# Patient Record
Sex: Female | Born: 1975
Health system: Southern US, Community
[De-identification: ages and names within clinical notes are randomized; demographics above are authoritative.]

## PROBLEM LIST (undated history)

## (undated) DIAGNOSIS — D649 Anemia, unspecified: Secondary | ICD-10-CM

## (undated) DIAGNOSIS — C539 Malignant neoplasm of cervix uteri, unspecified: Secondary | ICD-10-CM

## (undated) DIAGNOSIS — G8929 Other chronic pain: Secondary | ICD-10-CM

## (undated) DIAGNOSIS — M26609 Unspecified temporomandibular joint disorder, unspecified side: Secondary | ICD-10-CM

## (undated) DIAGNOSIS — U071 COVID-19: Secondary | ICD-10-CM

## (undated) DIAGNOSIS — K219 Gastro-esophageal reflux disease without esophagitis: Secondary | ICD-10-CM

## (undated) DIAGNOSIS — Z8489 Family history of other specified conditions: Secondary | ICD-10-CM

## (undated) DIAGNOSIS — G43009 Migraine without aura, not intractable, without status migrainosus: Secondary | ICD-10-CM

## (undated) DIAGNOSIS — R251 Tremor, unspecified: Secondary | ICD-10-CM

## (undated) DIAGNOSIS — N644 Mastodynia: Secondary | ICD-10-CM

## (undated) DIAGNOSIS — F32A Depression, unspecified: Secondary | ICD-10-CM

## (undated) DIAGNOSIS — Z9889 Other specified postprocedural states: Secondary | ICD-10-CM

## (undated) DIAGNOSIS — E282 Polycystic ovarian syndrome: Secondary | ICD-10-CM

## (undated) DIAGNOSIS — J45909 Unspecified asthma, uncomplicated: Secondary | ICD-10-CM

## (undated) DIAGNOSIS — F419 Anxiety disorder, unspecified: Secondary | ICD-10-CM

## (undated) DIAGNOSIS — M1990 Unspecified osteoarthritis, unspecified site: Secondary | ICD-10-CM

## (undated) HISTORY — PX: WRIST SURGERY: SHX841

## (undated) HISTORY — DX: Malignant neoplasm of cervix uteri, unspecified: C53.9

## (undated) HISTORY — PX: ROBOTIC ASSISTED TOTAL HYSTERECTOMY: SHX6085

## (undated) HISTORY — DX: Polycystic ovarian syndrome: E28.2

## (undated) HISTORY — DX: Tremor, unspecified: R25.1

## (undated) HISTORY — DX: Other chronic pain: G89.29

## (undated) HISTORY — DX: Migraine without aura, not intractable, without status migrainosus: G43.009

## (undated) HISTORY — DX: Depression, unspecified: F32.A

---

## 1999-09-21 ENCOUNTER — Ambulatory Visit (HOSPITAL_BASED_OUTPATIENT_CLINIC_OR_DEPARTMENT_OTHER): Admission: RE | Admit: 1999-09-21 | Discharge: 1999-09-21 | Payer: Self-pay | Admitting: Orthopedic Surgery

## 2005-11-27 ENCOUNTER — Emergency Department (HOSPITAL_COMMUNITY): Admission: EM | Admit: 2005-11-27 | Discharge: 2005-11-27 | Payer: Self-pay | Admitting: Emergency Medicine

## 2006-02-17 ENCOUNTER — Inpatient Hospital Stay (HOSPITAL_COMMUNITY): Admission: AD | Admit: 2006-02-17 | Discharge: 2006-02-17 | Payer: Self-pay | Admitting: Obstetrics & Gynecology

## 2006-05-30 ENCOUNTER — Emergency Department (HOSPITAL_COMMUNITY): Admission: EM | Admit: 2006-05-30 | Discharge: 2006-05-30 | Payer: Self-pay | Admitting: Emergency Medicine

## 2007-09-12 ENCOUNTER — Other Ambulatory Visit: Admission: RE | Admit: 2007-09-12 | Discharge: 2007-09-12 | Payer: Self-pay | Admitting: Family Medicine

## 2007-10-24 ENCOUNTER — Emergency Department (HOSPITAL_COMMUNITY): Admission: EM | Admit: 2007-10-24 | Discharge: 2007-10-24 | Payer: Self-pay | Admitting: Emergency Medicine

## 2008-01-22 ENCOUNTER — Emergency Department (HOSPITAL_COMMUNITY): Admission: EM | Admit: 2008-01-22 | Discharge: 2008-01-22 | Payer: Self-pay | Admitting: Emergency Medicine

## 2008-01-31 ENCOUNTER — Emergency Department (HOSPITAL_COMMUNITY): Admission: EM | Admit: 2008-01-31 | Discharge: 2008-02-01 | Payer: Self-pay | Admitting: Emergency Medicine

## 2008-07-25 ENCOUNTER — Emergency Department (HOSPITAL_COMMUNITY): Admission: EM | Admit: 2008-07-25 | Discharge: 2008-07-25 | Payer: Self-pay | Admitting: Emergency Medicine

## 2008-09-26 ENCOUNTER — Other Ambulatory Visit: Admission: RE | Admit: 2008-09-26 | Discharge: 2008-09-26 | Payer: Self-pay | Admitting: Family Medicine

## 2008-10-08 ENCOUNTER — Inpatient Hospital Stay (HOSPITAL_COMMUNITY): Admission: AD | Admit: 2008-10-08 | Discharge: 2008-10-08 | Payer: Self-pay | Admitting: Obstetrics and Gynecology

## 2008-10-15 ENCOUNTER — Inpatient Hospital Stay (HOSPITAL_COMMUNITY): Admission: AD | Admit: 2008-10-15 | Discharge: 2008-10-15 | Payer: Self-pay | Admitting: Obstetrics and Gynecology

## 2010-02-02 ENCOUNTER — Emergency Department (HOSPITAL_COMMUNITY)
Admission: EM | Admit: 2010-02-02 | Discharge: 2010-02-02 | Payer: Self-pay | Source: Home / Self Care | Admitting: Emergency Medicine

## 2010-02-12 ENCOUNTER — Emergency Department (HOSPITAL_COMMUNITY)
Admission: EM | Admit: 2010-02-12 | Discharge: 2010-02-12 | Payer: Self-pay | Source: Home / Self Care | Admitting: Emergency Medicine

## 2010-04-23 LAB — CBC
HCT: 39.7 % (ref 36.0–46.0)
HCT: 41.5 % (ref 36.0–46.0)
Hemoglobin: 13.6 g/dL (ref 12.0–15.0)
Hemoglobin: 14 g/dL (ref 12.0–15.0)
MCHC: 33.8 g/dL (ref 30.0–36.0)
MCHC: 34.4 g/dL (ref 30.0–36.0)
MCV: 85.2 fL (ref 78.0–100.0)
MCV: 86.5 fL (ref 78.0–100.0)
Platelets: 209 10*3/uL (ref 150–400)
Platelets: 219 10*3/uL (ref 150–400)
RBC: 4.66 MIL/uL (ref 3.87–5.11)
RBC: 4.79 MIL/uL (ref 3.87–5.11)
RDW: 13.2 % (ref 11.5–15.5)
RDW: 13.3 % (ref 11.5–15.5)
WBC: 6.6 10*3/uL (ref 4.0–10.5)
WBC: 7 10*3/uL (ref 4.0–10.5)

## 2010-04-23 LAB — URINALYSIS, ROUTINE W REFLEX MICROSCOPIC
Bilirubin Urine: NEGATIVE
Bilirubin Urine: NEGATIVE
Glucose, UA: NEGATIVE mg/dL
Glucose, UA: NEGATIVE mg/dL
Hgb urine dipstick: NEGATIVE
Hgb urine dipstick: NEGATIVE
Ketones, ur: NEGATIVE mg/dL
Ketones, ur: NEGATIVE mg/dL
Nitrite: NEGATIVE
Nitrite: NEGATIVE
Protein, ur: NEGATIVE mg/dL
Protein, ur: NEGATIVE mg/dL
Specific Gravity, Urine: >1.03 — ABNORMAL HIGH (ref 1.005–1.030)
Specific Gravity, Urine: >1.03 — ABNORMAL HIGH (ref 1.005–1.030)
Urobilinogen, UA: 0.2 mg/dL (ref 0.0–1.0)
Urobilinogen, UA: 0.2 mg/dL (ref 0.0–1.0)
pH: 5.5 (ref 5.0–8.0)
pH: 6 (ref 5.0–8.0)

## 2010-04-23 LAB — COMPREHENSIVE METABOLIC PANEL
ALT: 12 U/L (ref 0–35)
ALT: 14 U/L (ref 0–35)
AST: 13 U/L (ref 0–37)
AST: 15 U/L (ref 0–37)
Albumin: 3.6 g/dL (ref 3.5–5.2)
Albumin: 3.7 g/dL (ref 3.5–5.2)
Alkaline Phosphatase: 50 U/L (ref 39–117)
Alkaline Phosphatase: 52 U/L (ref 39–117)
BUN: 7 mg/dL (ref 6–23)
BUN: 9 mg/dL (ref 6–23)
CO2: 24 mEq/L (ref 19–32)
CO2: 24 mEq/L (ref 19–32)
Calcium: 8.7 mg/dL (ref 8.4–10.5)
Calcium: 8.8 mg/dL (ref 8.4–10.5)
Chloride: 103 mEq/L (ref 96–112)
Chloride: 107 mEq/L (ref 96–112)
Creatinine, Ser: 0.72 mg/dL (ref 0.4–1.2)
Creatinine, Ser: 0.78 mg/dL (ref 0.4–1.2)
GFR calc Af Amer: >60 mL/min (ref >60)
GFR calc Af Amer: >60 mL/min (ref >60)
GFR calc non Af Amer: >60 mL/min (ref >60)
GFR calc non Af Amer: >60 mL/min (ref >60)
Glucose, Bld: 126 mg/dL — ABNORMAL HIGH (ref 70–99)
Glucose, Bld: 81 mg/dL (ref 70–99)
Potassium: 3.8 mEq/L (ref 3.5–5.1)
Potassium: 3.9 mEq/L (ref 3.5–5.1)
Sodium: 133 mEq/L — ABNORMAL LOW (ref 135–145)
Sodium: 136 mEq/L (ref 135–145)
Total Bilirubin: 0.4 mg/dL (ref 0.3–1.2)
Total Bilirubin: 0.6 mg/dL (ref 0.3–1.2)
Total Protein: 6.3 g/dL (ref 6.0–8.3)
Total Protein: 6.8 g/dL (ref 6.0–8.3)

## 2010-04-23 LAB — WET PREP, GENITAL
Clue Cells Wet Prep HPF POC: NONE SEEN
Trich, Wet Prep: NONE SEEN
Yeast Wet Prep HPF POC: NONE SEEN

## 2010-04-23 LAB — POCT PREGNANCY, URINE
Preg Test, Ur: NEGATIVE
Preg Test, Ur: NEGATIVE

## 2010-04-23 LAB — GC/CHLAMYDIA PROBE AMP, GENITAL
Chlamydia, DNA Probe: NEGATIVE
GC Probe Amp, Genital: NEGATIVE

## 2010-04-25 LAB — RAPID URINE DRUG SCREEN, HOSP PERFORMED
Amphetamines: NOT DETECTED
Barbiturates: NOT DETECTED
Benzodiazepines: NOT DETECTED
Cocaine: NOT DETECTED
Opiates: NOT DETECTED
Tetrahydrocannabinol: NOT DETECTED

## 2010-04-25 LAB — URINALYSIS, ROUTINE W REFLEX MICROSCOPIC
Bilirubin Urine: NEGATIVE
Glucose, UA: NEGATIVE mg/dL
Hgb urine dipstick: NEGATIVE
Ketones, ur: NEGATIVE mg/dL
Nitrite: NEGATIVE
Protein, ur: NEGATIVE mg/dL
Specific Gravity, Urine: 1.021 (ref 1.005–1.030)
Urobilinogen, UA: 1 mg/dL (ref 0.0–1.0)
pH: 6 (ref 5.0–8.0)

## 2010-05-03 LAB — CBC
HCT: 40 % (ref 36.0–46.0)
Hemoglobin: 13.4 g/dL (ref 12.0–15.0)
MCHC: 33.4 g/dL (ref 30.0–36.0)
MCV: 86 fL (ref 78.0–100.0)
Platelets: 205 10*3/uL (ref 150–400)
RBC: 4.65 MIL/uL (ref 3.87–5.11)
RDW: 13.5 % (ref 11.5–15.5)
WBC: 7.3 10*3/uL (ref 4.0–10.5)

## 2010-05-03 LAB — POCT CARDIAC MARKERS
CKMB, poc: <1 ng/mL — ABNORMAL LOW (ref 1.0–8.0)
Myoglobin, poc: 36.4 ng/mL (ref 12–200)
Troponin i, poc: <0.05 ng/mL (ref 0.00–0.09)

## 2010-05-03 LAB — D-DIMER, QUANTITATIVE (NOT AT ARMC): D-Dimer, Quant: 0.27 ug/mL-FEU (ref 0.00–0.48)

## 2010-06-04 NOTE — Op Note (Signed)
[facility]  Patient:    Robin Weaver, Robin Weaver                       MRN: [id number] Proc. Date: 09/21/99 Adm. Date:  [id number] Attending:  Ronne Binning                           Operative Report  PREOPERATIVE DIAGNOSIS:  Right wrist pain with ganglion cyst, questionable triangular fibrocartilage complex tear.  POSTOPERATIVE DIAGNOSIS:  Right wrist pain with ganglion cyst, questionable triangular fibrocartilage complex  OPERATION:  Arthroscopy, debridement triangular fibrocartilage complex tear, removal of ganglion cyst - right wrist.  SURGEON:  Nicki Reaper, M.D.  ANESTHESIA:  Axillary block.  ANESTHESIOLOGIST:  Kaylyn Layer. Michelle Piper, M.D.  HISTORY:  The patient is a 35 year old female with a history of right wrist pain.  She has had a MRI done revealing questionable TFCC tear.  She has developed a mass over the dorsal aspect, fourth dorsal compartment.  PROCEDURE:  The patient was brought to the operating room, where an axillary block was carried out without difficulty.  She was prepped and draped using Betadine scrub and solution with the right arm free.  The limb was placed in the arthroscopy tower and 10 pounds of traction applied.  The joint inflated through the 4.5 portal.  The joint was entered through the 4.5 portal after localization with a needle.  A transverse incision was made, deepened with a hemostat.  A blunt trocar was used to enter the joint.  The joint was inspected.  The lunotriquetral joint was intact.  There was moderate synovitis present to the ulnar side.  The scapholunate ligament was slightly patulous, but appeared to be intact.  The volar, ulnar and radial ligaments were intact. The cartilage was intact.  The dorsal capsule was then inspected with pressure on the dorsal aspect, the cyst was immediately apparent.  A 3.4 portal was then opened after localization with a 22-gauge needle.  This was opened with a transverse  incision.  A hemostat was used to enter the joint.  The shaver was the introduced.  A second daughter cyst was present on the scapholunate ligament and this was debrided, as was the cyst opening the dorsal capsule until the flexor tendons were visible.  The scope was then placed in 3.4.  The ulnar aspect of the joint inspected and cleavage tear was present on the triangular fibrocartilage.  This was then removed with basket forceps, a shaver and smoothed with an ArthroWand.  A large prestyloid recess was present communicating with the extensor carpi ulnaris, but the TFCC dorsal attachment and peripheral attachment was entirely intact with a normal trampoline effect. The mid carpal joint was then inspected through the distal 3.4 and 5.6 portals, inflated, transverse incision was made, deepened with a hemostat, joint was entered.  STT joint showed no articular damage.  The scapholunate showed no opening.  Proximal capitate, distal lunate were normal.  There was a hamate facet on the lunate.  The lunotriquetral joint and hamate were entirely normal.  Triquetrium showed no injuries, no further lesions were identified. The instruments were removed.  Portals closed with interrupted 5-0 nylon sutures.  Sterile compressive dressing and splint was applied.  The patient tolerated the procedure well and was taken to the recovery room for observation in satisfactory condition.  She is discharged home to return to the [facility] in one  week on Vicodin and Keflex. DD:  09/21/99 TD:  09/21/99 Job: [id number]

## 2011-04-04 ENCOUNTER — Ambulatory Visit (INDEPENDENT_AMBULATORY_CARE_PROVIDER_SITE_OTHER): Payer: BC Managed Care – PPO | Admitting: Physician Assistant

## 2011-04-04 VITALS — BP 110/60 | HR 76 | Temp 98.8°F | Resp 12 | Ht 69.0 in | Wt 205.0 lb

## 2011-04-04 DIAGNOSIS — H9209 Otalgia, unspecified ear: Secondary | ICD-10-CM

## 2011-04-04 DIAGNOSIS — Z8541 Personal history of malignant neoplasm of cervix uteri: Secondary | ICD-10-CM | POA: Insufficient documentation

## 2011-04-04 DIAGNOSIS — C539 Malignant neoplasm of cervix uteri, unspecified: Secondary | ICD-10-CM

## 2011-04-04 MED ORDER — TRAMADOL HCL 50 MG PO TABS: 50.0000 mg | ORAL_TABLET | Freq: Three times a day (TID) | ORAL | Status: AC | PRN

## 2011-04-04 MED ORDER — CEPHALEXIN 500 MG PO CAPS: 500.0000 mg | ORAL_CAPSULE | Freq: Three times a day (TID) | ORAL | Status: AC

## 2011-04-04 NOTE — Progress Notes (Signed)
  Subjective:    Patient ID: Robin Weaver, female    DOB: 04-09-1975, 36 y.o.   MRN: [id number]  HPI 36 y/o WF presents with three day history of ear pain.  Specifically located over tragus.  No rash, no paresthesias, no facial weakness.  No tinnitus, dizziness, nausea, jaw pain, gum infection.  No recent trauma, including dental work or ear piercings.   Review of Systems Gen:  No fever, no chills. HEENT:  Ear pain, no drainage.  Denies rhinorrhea, sore throat. Resp:  Cough Lymph: No swollen glands    Objective:   Physical Exam GEN:  Afebrile.  Does not look ill. HEENT:  Point tender over left tragus.  Canal without edema or drainage.  TMs clear bilaterally.  No rash on face.  No hyperesthesia.  No oral involvement Lungs:  Clear to auscultation Card: RRR Lymph:  No cervical or preauricular lymphadenopathy      Assessment & Plan:  Otalgia.  Likely pimple in tragus.  No concerning findings for nerve involvement.   Treat with Keflex 500mg  TID for seven days.  Tramadol for pain to avoid overuse of ibuprofen.   Call if symptoms do not improve in the next 48 hours, or if they worsen with pain, rash or increased headache.

## 2011-04-04 NOTE — Patient Instructions (Signed)
Take Tramadol every eight hours as needed.  Avoid excess ibuprofen use while on this medication.  Take antibiotics as prescribed.  Return if symptoms worsen or do not improve with treatment.

## 2011-04-20 ENCOUNTER — Emergency Department (HOSPITAL_COMMUNITY)
Admission: EM | Admit: 2011-04-20 | Discharge: 2011-04-20 | Disposition: A | Discharge status: Home / Self Care | Payer: BC Managed Care – PPO | Attending: Emergency Medicine | Admitting: Emergency Medicine

## 2011-04-20 ENCOUNTER — Encounter (HOSPITAL_COMMUNITY): Payer: Self-pay

## 2011-04-20 DIAGNOSIS — F411 Generalized anxiety disorder: Secondary | ICD-10-CM | POA: Insufficient documentation

## 2011-04-20 DIAGNOSIS — Z886 Allergy status to analgesic agent status: Secondary | ICD-10-CM | POA: Insufficient documentation

## 2011-04-20 DIAGNOSIS — F17200 Nicotine dependence, unspecified, uncomplicated: Secondary | ICD-10-CM | POA: Insufficient documentation

## 2011-04-20 DIAGNOSIS — M26629 Arthralgia of temporomandibular joint, unspecified side: Secondary | ICD-10-CM

## 2011-04-20 DIAGNOSIS — M2669 Other specified disorders of temporomandibular joint: Secondary | ICD-10-CM | POA: Insufficient documentation

## 2011-04-20 HISTORY — DX: Anxiety disorder, unspecified: F41.9

## 2011-04-20 MED ORDER — TRAMADOL HCL 50 MG PO TABS: 50.0000 mg | ORAL_TABLET | Freq: Four times a day (QID) | ORAL | Status: AC | PRN

## 2011-04-20 NOTE — Discharge Instructions (Signed)
Temporomandibular Problems  Temporomandibular joint (TMJ) dysfunction means there are problems with the joint between your jaw and your skull. This is a joint lined by cartilage like other joints in your body but also has a small disc in the joint which keeps the bones from rubbing on each other. These joints are like other joints and can get inflamed (sore) from arthritis and other problems. When this joint gets sore, it can cause headaches and pain in the jaw and the face. CAUSES  Usually the arthritic types of problems are caused by soreness in the joint. Soreness in the joint can also be caused by overuse. This may come from grinding your teeth. It may also come from mis-alignment in the joint. DIAGNOSIS Diagnosis of this condition can often be made by history and exam. Sometimes your caregiver may need X-rays or an MRI scan to determine the exact cause. It may be necessary to see your dentist to determine if your teeth and jaws are lined up correctly. TREATMENT  Most of the time this problem is not serious; however, sometimes it can persist (become chronic). When this happens medications that will cut down on inflammation (soreness) help. Sometimes a shot of cortisone into the joint will be helpful. If your teeth are not aligned it may help for your dentist to make a splint for your mouth that can help this problem. If no physical problems can be found, the problem may come from tension. If tension is found to be the cause, biofeedback or relaxation techniques may be helpful. HOME CARE INSTRUCTIONS   Later in the day, applications of ice packs may be helpful. Ice can be used in a plastic bag with a towel around it to prevent frostbite to skin. This may be used about every 2 hours for 20 to 30 minutes, as needed while awake, or as directed by your caregiver.   Only take over-the-counter or prescription medicines for pain, discomfort, or fever as directed by your caregiver.   If physical therapy was  prescribed, follow your caregiver's directions.   Wear mouth appliances as directed if they were given.  Document Released: 09/28/2000 Document Revised: 12/23/2010 Document Reviewed: 01/06/2008 [facility] Patient Information 2012 [place], [place].  Call Dr. Chales Salmon today to schedule the next available office appointment. Tell office staff that you were seen here on making the appointment. Take Advil 4 tablets 3 times daily in addition to Ultram prescribed

## 2011-04-20 NOTE — ED Notes (Signed)
Pt complains of left sided jaw pain since the middle of March, was seen at urgent Care on the 18th and received pain meds and antibiotics with no relief

## 2011-04-20 NOTE — ED Provider Notes (Signed)
History     CSN: [id number]  Arrival date & time 04/20/11  7829   First MD Initiated Contact with Patient 04/20/11 [phone]      Chief Complaint  Patient presents with  . Jaw Pain    (Consider location/radiation/quality/duration/timing/severity/associated sxs/prior treatment) HPI Complains of left jaw pain for the past 2 weeks points to TMJ area left side pain is worse with opening her mouth. No fever. Seen at urgent care Center prescribe an antibiotic and tramadol with transient relief. No fever no trauma no other associated symptoms. Patient also reports she's taken ibuprofen without relief . No other associated symptom. Past Medical History  Diagnosis Date  . Anxiety     History reviewed. No pertinent past surgical history. Past medical history History reviewed. No pertinent family history. Cervical cancer Surgical history hysterectomy History  Substance Use Topics  . Smoking status: Current Everyday Smoker -- 0.5 packs/day for 12 years    Types: Cigarettes  . Smokeless tobacco: Never Used  . Alcohol Use: Not on file   No alcohol OB History    Grav Para Term Preterm Abortions TAB SAB Ect Mult Living                  Review of Systems  HENT:       Jaw pain  All other systems reviewed and are negative.    Allergies  Vicodin  Home Medications   Current Outpatient Rx  Name Route Sig Dispense Refill  . ALBUTEROL SULFATE HFA 108 (90 BASE) MCG/ACT IN AERS Inhalation Inhale 2 puffs into the lungs every 6 (six) hours as needed.    . ALPRAZOLAM 0.25 MG PO TABS Oral Take 0.25 mg by mouth as needed.    Marland Kitchen ESCITALOPRAM OXALATE 10 MG PO TABS Oral Take 10 mg by mouth daily.    . IBUPROFEN 200 MG PO TABS Oral Take 400 mg by mouth every 4 (four) hours as needed. For pain      BP 114/74  Temp(Src) 97.7 F (36.5 C) (Oral)  Resp 20  Wt 201 lb (91.173 kg)  SpO2 97%  Physical Exam  Nursing note and vitals reviewed. Constitutional: She appears well-developed and  well-nourished. No distress.  HENT:  Right Ear: External ear normal.  Left Ear: External ear normal.       Bilateral tympanic membranes normal. No trismus of pain at left TMJ upon opening her mouth. No obvious dental infection  Neck: Neck supple.  Cardiovascular: Normal rate.   Pulmonary/Chest: Effort normal and breath sounds normal.  Abdominal: She exhibits no distension.  Lymphadenopathy:    She has no cervical adenopathy.    ED Course  Procedures (including critical care time)  Labs Reviewed - No data to display No results found.   No diagnosis found.    MDM  Plan prescription tramadol; continue ibuprofen Referral oral surgery Dr.Owsley Diagnosis TMJ syndrome        Doug Sou, MD 04/20/11 [phone]

## 2011-04-28 ENCOUNTER — Inpatient Hospital Stay (HOSPITAL_COMMUNITY): Payer: BC Managed Care – PPO

## 2011-04-28 ENCOUNTER — Encounter (HOSPITAL_COMMUNITY): Payer: Self-pay | Admitting: *Deleted

## 2011-04-28 ENCOUNTER — Inpatient Hospital Stay (HOSPITAL_COMMUNITY)
Admission: AD | Admit: 2011-04-28 | Discharge: 2011-04-28 | Disposition: A | Discharge status: Home / Self Care | Payer: BC Managed Care – PPO | Source: Ambulatory Visit | Attending: Obstetrics and Gynecology | Admitting: Obstetrics and Gynecology

## 2011-04-28 DIAGNOSIS — N83209 Unspecified ovarian cyst, unspecified side: Secondary | ICD-10-CM | POA: Insufficient documentation

## 2011-04-28 DIAGNOSIS — Z90710 Acquired absence of both cervix and uterus: Secondary | ICD-10-CM | POA: Insufficient documentation

## 2011-04-28 DIAGNOSIS — R109 Unspecified abdominal pain: Secondary | ICD-10-CM | POA: Insufficient documentation

## 2011-04-28 HISTORY — DX: Unspecified temporomandibular joint disorder, unspecified side: M26.609

## 2011-04-28 LAB — CBC
HCT: 40.5 % (ref 36.0–46.0)
Hemoglobin: 13.9 g/dL (ref 12.0–15.0)
MCH: 28.5 pg (ref 26.0–34.0)
MCHC: 34.3 g/dL (ref 30.0–36.0)
MCV: 83.2 fL (ref 78.0–100.0)
Platelets: 211 10*3/uL (ref 150–400)
RBC: 4.87 MIL/uL (ref 3.87–5.11)
RDW: 13.2 % (ref 11.5–15.5)
WBC: 7.6 10*3/uL (ref 4.0–10.5)

## 2011-04-28 LAB — URINALYSIS, ROUTINE W REFLEX MICROSCOPIC
Bilirubin Urine: NEGATIVE
Glucose, UA: NEGATIVE mg/dL
Hgb urine dipstick: NEGATIVE
Ketones, ur: NEGATIVE mg/dL
Leukocytes, UA: NEGATIVE
Nitrite: NEGATIVE
Protein, ur: NEGATIVE mg/dL
Specific Gravity, Urine: 1.025 (ref 1.005–1.030)
Urobilinogen, UA: 0.2 mg/dL (ref 0.0–1.0)
pH: 6 (ref 5.0–8.0)

## 2011-04-28 LAB — DIFFERENTIAL
Basophils Absolute: 0 10*3/uL (ref 0.0–0.1)
Basophils Relative: 1 % (ref 0–1)
Eosinophils Absolute: 0.3 10*3/uL (ref 0.0–0.7)
Eosinophils Relative: 4 % (ref 0–5)
Lymphocytes Relative: 23 % (ref 12–46)
Lymphs Abs: 1.8 10*3/uL (ref 0.7–4.0)
Monocytes Absolute: 0.5 10*3/uL (ref 0.1–1.0)
Monocytes Relative: 6 % (ref 3–12)
Neutro Abs: 5 10*3/uL (ref 1.7–7.7)
Neutrophils Relative %: 66 % (ref 43–77)

## 2011-04-28 MED ORDER — KETOROLAC TROMETHAMINE 60 MG/2ML IM SOLN
30.0000 mg | Freq: Once | INTRAMUSCULAR | Status: AC
  Administered 2011-04-28: 30 mg via INTRAMUSCULAR
  Filled 2011-04-28: qty 2

## 2011-04-28 MED ORDER — OXYCODONE-ACETAMINOPHEN 5-325 MG PO TABS: 1.0000 | ORAL_TABLET | Freq: Four times a day (QID) | ORAL | Status: AC | PRN

## 2011-04-28 NOTE — MAU Note (Signed)
Pt states she had sudden onset of RLQ pain @ 0130 when she got off from work.  Pt went to bed, got up @ 0600 in severe pain.  Pt C/O nausea, no vomitting.

## 2011-04-28 NOTE — Discharge Instructions (Signed)
Ovarian Cyst An ovarian cyst is a sac filled with fluid or blood. This sac is attached to the ovary. Some cysts go away on their own. Other cysts need treatment.  HOME CARE   Only take medicine as told by your doctor.   Follow up with your doctor as told.  GET HELP RIGHT AWAY IF:   You develop sudden pain.   Your belly (abdomen) becomes large or puffy (swollen).   You have a hard time peeing (totally emptying your bladder).   You feel sick most of the time.   You have a temperature by mouth above 102 F (38.9 C), not controlled by medicine.   Your periods are late, not regular, or painful.   Your belly or pelvic pain does not go away.   You have pressure on your bladder.   You have pain during sex.   You feel fullness, pressure, or discomfort in your belly.   You lose weight for no reason.  MAKE SURE YOU:   Understand these instructions.   Will watch your condition.   Will get help right away if you are not doing well or get worse.  Document Released: 06/22/2007 Document Revised: 12/23/2010 Document Reviewed: 12/05/2008 [employer] Patient Information 2012 [employer]. 

## 2011-04-28 NOTE — MAU Provider Note (Signed)
History     CSN: [id number]  Arrival date and time: 04/28/11 2440   First Provider Initiated Contact with Patient 04/28/11 0805      Chief Complaint  Patient presents with  . Abdominal Pain   HPIJessica A Weaver is 36 y.o. G1P1001 Previous patient of Dr. Roxanne Gates with right sided pain,  now sees Dr. Sabino Snipes at [facility] after having cervical cancer in 2011. Had hysterectomy.  Ureter was nicked at the time of hysterectomy, stent placed on left.  Chemo/radiation not necessary.  Hx of ovarian cysts, "most of the time they come and go but if this is it, I cannot stand up straight and crying and I never cry".  This pain began at 1:30 as mild and woke up at 6:30 and when she stood up the pain became severe.  Nausea without vomiting, denies fever.  Normal appetite yesterday, hasn't eaten today.  Denies constipation/diarrhea.   In a steady 3 year relationship.   Past Medical History  Diagnosis Date  . Anxiety   . TMJ disease     Past Surgical History  Procedure Date  . Abdominal hysterectomy   . Wrist surgery     History reviewed. No pertinent family history.  History  Substance Use Topics  . Smoking status: Current Everyday Smoker -- 0.5 packs/day for 12 years    Types: Cigarettes  . Smokeless tobacco: Never Used  . Alcohol Use: Yes     occasional    Allergies:  Allergies  Allergen Reactions  . Vicodin (Hydrocodone-Acetaminophen) Nausea Only    Prescriptions prior to admission  Medication Sig Dispense Refill  . escitalopram (LEXAPRO) 10 MG tablet Take 10 mg by mouth daily.      Marland Kitchen ibuprofen (ADVIL,MOTRIN) 200 MG tablet Take 400 mg by mouth every 4 (four) hours as needed. For pain      . traMADol (ULTRAM) 50 MG tablet Take 1 tablet (50 mg total) by mouth every 6 (six) hours as needed for pain.  15 tablet  0  . albuterol (PROVENTIL HFA;VENTOLIN HFA) 108 (90 BASE) MCG/ACT inhaler Inhale 2 puffs into the lungs every 6 (six) hours as needed. For asthma      . ALPRAZolam  (XANAX) 0.25 MG tablet Take 0.25 mg by mouth as needed. For panic anxiety. Been more than a month that she has used.        Review of Systems  Constitutional: Negative for fever and chills.  Gastrointestinal: Positive for nausea and abdominal pain (>right than left ). Negative for vomiting.  Genitourinary:       Negative for vaginal bleeding or discharge  Psychiatric/Behavioral: Negative for depression and suicidal ideas.   Physical Exam   Blood pressure 126/55, pulse 81, temperature 97.2 F (36.2 C), temperature source Oral, resp. rate 18.  Physical Exam  Constitutional: She is oriented to person, place, and time. She appears well-developed and well-nourished. She appears distressed (uncomfortable).  HENT:  Head: Normocephalic.  Neck: Normal range of motion.  Cardiovascular: Normal rate.   Respiratory: Effort normal.  GI: Soft. She exhibits no distension and no mass. There is tenderness (dififuse tenderness in the lower abdomen bilaterally). There is no rebound and no guarding.  Genitourinary: Rectal exam shows no tenderness. Right adnexum displays tenderness (moderate on the right, mild on the left). Right adnexum displays no mass and no fullness. Left adnexum displays tenderness. Left adnexum displays no mass and no fullness. No tenderness or bleeding around the vagina. No vaginal discharge found.  Uterus is surgically absent  Neurological: She is alert and oriented to person, place, and time.  Skin: Skin is warm and dry.  Psychiatric: She has a normal mood and affect. Her behavior is normal.   Results for orders placed during the hospital encounter of 04/28/11 (from the past 24 hour(s))  CBC     Status: Normal   Collection Time   04/28/11  8:21 AM      Component Value Range   WBC 7.6  4.0 - 10.5 (K/uL)   RBC 4.87  3.87 - 5.11 (MIL/uL)   Hemoglobin 13.9  12.0 - 15.0 (g/dL)   HCT 16.1  09.6 - 04.5 (%)   MCV 83.2  78.0 - 100.0 (fL)   MCH 28.5  26.0 - 34.0 (pg)   MCHC 34.3   30.0 - 36.0 (g/dL)   RDW 40.9  81.1 - 91.4 (%)   Platelets 211  150 - 400 (K/uL)  DIFFERENTIAL     Status: Normal   Collection Time   04/28/11  8:21 AM      Component Value Range   Neutrophils Relative 66  43 - 77 (%)   Neutro Abs 5.0  1.7 - 7.7 (K/uL)   Lymphocytes Relative 23  12 - 46 (%)   Lymphs Abs 1.8  0.7 - 4.0 (K/uL)   Monocytes Relative 6  3 - 12 (%)   Monocytes Absolute 0.5  0.1 - 1.0 (K/uL)   Eosinophils Relative 4  0 - 5 (%)   Eosinophils Absolute 0.3  0.0 - 0.7 (K/uL)   Basophils Relative 1  0 - 1 (%)   Basophils Absolute 0.0  0.0 - 0.1 (K/uL)  URINALYSIS, ROUTINE W REFLEX MICROSCOPIC     Status: Normal   Collection Time   04/28/11  8:48 AM      Component Value Range   Color, Urine YELLOW  YELLOW    APPearance CLEAR  CLEAR    Specific Gravity, Urine 1.025  1.005 - 1.030    pH 6.0  5.0 - 8.0    Glucose, UA NEGATIVE  NEGATIVE (mg/dL)   Hgb urine dipstick NEGATIVE  NEGATIVE    Bilirubin Urine NEGATIVE  NEGATIVE    Ketones, ur NEGATIVE  NEGATIVE (mg/dL)   Protein, ur NEGATIVE  NEGATIVE (mg/dL)   Urobilinogen, UA 0.2  0.0 - 1.0 (mg/dL)   Nitrite NEGATIVE  NEGATIVE    Leukocytes, UA NEGATIVE  NEGATIVE    Clinical Data: Pelvic pain  TRANSABDOMINAL AND TRANSVAGINAL ULTRASOUND OF PELVIS  DOPPLER ULTRASOUND OF OVARIES  Technique: Both transabdominal and transvaginal ultrasound  examinations of the pelvis were performed. Transabdominal technique  was performed for global imaging of the pelvis including uterus,  ovaries, adnexal regions, and pelvic cul-de-sac.  It was necessary to proceed with endovaginal exam following the  transabdominal exam to visualize the adnexa.  Color and duplex Doppler ultrasound was utilized to evaluate blood  flow to the ovaries.  Comparison: October 08, 2008  Findings:  The uterus is surgically absent.  The left ovary is unremarkable in size and appearance, measuring  2.6 x 2.6 x 2.5 cm.  The right ovary contains a 3.1 cm hemorrhagic  cyst. The overall  right ovarian dimensions are 4.8 x 4.0 x 3.4 cm.  Trace free pelvic fluid is noted.  Pulsed Doppler evaluation demonstrates normal low-resistance  arterial and venous waveforms in both ovaries.  IMPRESSION:  There is a 3.1 cm hemorrhagic cyst on the right ovary. Follow-up  ultrasound in 6 -  8 weeks is recommended to document resolution.  No sonographic evidence for ovarian torsion.  Original Report Authenticated By: Brandon Melnick, M.D.      MAU Course  Procedures Patient declined GC/CHl cultures-steady relationship and "not concerned" for infection MDM Toradol 30mg  IM given for pain.   10:05 Patient is feeling a little better.  Patient states Vicodin causes nausea but she has taken Percocet without side effects in the past.   Assessment and Plan  A:  Hemorrhagic right ovarian cyst  P:  Rx for Percocet       Patient wants to follow up with her doctor at [facility].  She has a scheduled appointment for May  Obelia Bonello,EVE M 04/28/2011, 8:08 AM

## 2011-05-02 NOTE — MAU Provider Note (Signed)
Agree with above note.  Robin Weaver 05/02/2011 1:38 PM

## 2011-12-11 ENCOUNTER — Encounter (HOSPITAL_COMMUNITY): Payer: Self-pay | Admitting: Emergency Medicine

## 2011-12-11 ENCOUNTER — Emergency Department (HOSPITAL_COMMUNITY)
Admission: EM | Admit: 2011-12-11 | Discharge: 2011-12-11 | Disposition: A | Discharge status: Home / Self Care | Payer: BC Managed Care – PPO | Attending: Emergency Medicine | Admitting: Emergency Medicine

## 2011-12-11 ENCOUNTER — Emergency Department (HOSPITAL_COMMUNITY): Payer: BC Managed Care – PPO

## 2011-12-11 DIAGNOSIS — Z90710 Acquired absence of both cervix and uterus: Secondary | ICD-10-CM | POA: Insufficient documentation

## 2011-12-11 DIAGNOSIS — Z79899 Other long term (current) drug therapy: Secondary | ICD-10-CM | POA: Insufficient documentation

## 2011-12-11 DIAGNOSIS — R110 Nausea: Secondary | ICD-10-CM | POA: Insufficient documentation

## 2011-12-11 DIAGNOSIS — F17200 Nicotine dependence, unspecified, uncomplicated: Secondary | ICD-10-CM | POA: Insufficient documentation

## 2011-12-11 DIAGNOSIS — M549 Dorsalgia, unspecified: Secondary | ICD-10-CM | POA: Insufficient documentation

## 2011-12-11 DIAGNOSIS — IMO0001 Reserved for inherently not codable concepts without codable children: Secondary | ICD-10-CM | POA: Insufficient documentation

## 2011-12-11 DIAGNOSIS — F411 Generalized anxiety disorder: Secondary | ICD-10-CM | POA: Insufficient documentation

## 2011-12-11 DIAGNOSIS — R109 Unspecified abdominal pain: Secondary | ICD-10-CM | POA: Insufficient documentation

## 2011-12-11 LAB — URINALYSIS, ROUTINE W REFLEX MICROSCOPIC
Bilirubin Urine: NEGATIVE
Glucose, UA: NEGATIVE mg/dL
Hgb urine dipstick: NEGATIVE
Ketones, ur: NEGATIVE mg/dL
Leukocytes, UA: NEGATIVE
Nitrite: NEGATIVE
Protein, ur: NEGATIVE mg/dL
Specific Gravity, Urine: 1.022 (ref 1.005–1.030)
Urobilinogen, UA: 0.2 mg/dL (ref 0.0–1.0)
pH: 6.5 (ref 5.0–8.0)

## 2011-12-11 LAB — COMPREHENSIVE METABOLIC PANEL
ALT: 18 U/L (ref 0–35)
AST: 16 U/L (ref 0–37)
Albumin: 4.2 g/dL (ref 3.5–5.2)
Alkaline Phosphatase: 94 U/L (ref 39–117)
BUN: 12 mg/dL (ref 6–23)
CO2: 27 mEq/L (ref 19–32)
Calcium: 9.7 mg/dL (ref 8.4–10.5)
Chloride: 99 mEq/L (ref 96–112)
Creatinine, Ser: 0.69 mg/dL (ref 0.50–1.10)
GFR calc Af Amer: >90 mL/min (ref >90)
GFR calc non Af Amer: >90 mL/min (ref >90)
Glucose, Bld: 90 mg/dL (ref 70–99)
Potassium: 4.2 mEq/L (ref 3.5–5.1)
Sodium: 135 mEq/L (ref 135–145)
Total Bilirubin: 0.3 mg/dL (ref 0.3–1.2)
Total Protein: 7.5 g/dL (ref 6.0–8.3)

## 2011-12-11 LAB — CBC WITH DIFFERENTIAL/PLATELET
Basophils Absolute: 0 10*3/uL (ref 0.0–0.1)
Basophils Relative: 1 % (ref 0–1)
Eosinophils Absolute: 0.2 10*3/uL (ref 0.0–0.7)
Eosinophils Relative: 4 % (ref 0–5)
HCT: 44.2 % (ref 36.0–46.0)
Hemoglobin: 15.1 g/dL — ABNORMAL HIGH (ref 12.0–15.0)
Lymphocytes Relative: 28 % (ref 12–46)
Lymphs Abs: 1.9 10*3/uL (ref 0.7–4.0)
MCH: 28.2 pg (ref 26.0–34.0)
MCHC: 34.2 g/dL (ref 30.0–36.0)
MCV: 82.6 fL (ref 78.0–100.0)
Monocytes Absolute: 0.4 10*3/uL (ref 0.1–1.0)
Monocytes Relative: 6 % (ref 3–12)
Neutro Abs: 4.3 10*3/uL (ref 1.7–7.7)
Neutrophils Relative %: 62 % (ref 43–77)
Platelets: 244 10*3/uL (ref 150–400)
RBC: 5.35 MIL/uL — ABNORMAL HIGH (ref 3.87–5.11)
RDW: 13.4 % (ref 11.5–15.5)
WBC: 6.9 10*3/uL (ref 4.0–10.5)

## 2011-12-11 MED ORDER — KETOROLAC TROMETHAMINE 30 MG/ML IJ SOLN
30.0000 mg | Freq: Once | INTRAMUSCULAR | Status: AC
  Administered 2011-12-11: 30 mg via INTRAVENOUS
  Filled 2011-12-11: qty 1

## 2011-12-11 MED ORDER — KETOROLAC TROMETHAMINE 60 MG/2ML IM SOLN: 60.0000 mg | Freq: Once | INTRAMUSCULAR | Status: DC

## 2011-12-11 MED ORDER — IBUPROFEN 600 MG PO TABS: 600.0000 mg | ORAL_TABLET | Freq: Four times a day (QID) | ORAL | Status: DC | PRN

## 2011-12-11 MED ORDER — METHOCARBAMOL 500 MG PO TABS: 500.0000 mg | ORAL_TABLET | Freq: Two times a day (BID) | ORAL | Status: DC

## 2011-12-11 NOTE — ED Notes (Signed)
Pt woke during the night w/ pain beneath right shoulder blade, radiates around to front and down into groin area. Tried to work today but had to leave. Nausea w/o emesis, denies urinary sx, diarrhea. Hx of cervical CA 2010

## 2011-12-11 NOTE — ED Provider Notes (Signed)
History     CSN: [id number]  Arrival date & time 12/11/11  4098   First MD Initiated Contact with Patient 12/11/11 1011      Chief Complaint  Patient presents with  . Back Pain  . Abdominal Pain    (Consider location/radiation/quality/duration/timing/severity/associated sxs/prior treatment) HPI Pt with R flank/thoracic pain starting yesterday and progressing throughout the night. Pain radiates down back and to R abdomen. +nausea without vomiting. No fever or chills. No urinary or vaginal symptoms. No SOB, or cough. No recent travel or surgeries.  Past Medical History  Diagnosis Date  . Anxiety   . TMJ disease     Past Surgical History  Procedure Date  . Abdominal hysterectomy   . Wrist surgery     No family history on file.  History  Substance Use Topics  . Smoking status: Current Every Day Smoker -- 0.5 packs/day for 12 years    Types: Cigarettes  . Smokeless tobacco: Never Used  . Alcohol Use: Yes     Comment: occasional    OB History    Grav Para Term Preterm Abortions TAB SAB Ect Mult Living   1 1 1       1       Review of Systems  Constitutional: Negative for fever, chills and fatigue.  HENT: Negative for neck pain and neck stiffness.   Respiratory: Negative for cough, chest tightness, shortness of breath and wheezing.   Cardiovascular: Negative for chest pain, palpitations and leg swelling.  Gastrointestinal: Positive for nausea and abdominal pain. Negative for vomiting, diarrhea and constipation.  Genitourinary: Negative for dysuria, frequency, hematuria, vaginal bleeding, vaginal discharge, difficulty urinating and pelvic pain.  Musculoskeletal: Positive for myalgias and back pain.  Skin: Negative for rash and wound.  Neurological: Negative for weakness, numbness and headaches.    Allergies  Vicodin  Home Medications   Current Outpatient Rx  Name  Route  Sig  Dispense  Refill  . ALBUTEROL SULFATE HFA 108 (90 BASE) MCG/ACT IN AERS    Inhalation   Inhale 2 puffs into the lungs every 6 (six) hours as needed. For asthma         . ASPIRIN-ACETAMINOPHEN-CAFFEINE 250-250-65 MG PO TABS   Oral   Take 2 tablets by mouth every 8 (eight) hours as needed. For pain.         Marland Kitchen CLONAZEPAM 0.5 MG PO TABS   Oral   Take 0.5 mg by mouth 3 (three) times daily as needed. For anxiety.         . ESCITALOPRAM OXALATE 20 MG PO TABS   Oral   Take 20 mg by mouth daily.         . IBUPROFEN 600 MG PO TABS   Oral   Take 1 tablet (600 mg total) by mouth every 6 (six) hours as needed for pain.   30 tablet   0   . METHOCARBAMOL 500 MG PO TABS   Oral   Take 1 tablet (500 mg total) by mouth 2 (two) times daily.   20 tablet   0     BP 100/54  Pulse 66  Temp 98 F (36.7 C) (Oral)  Resp 18  SpO2 98%  Physical Exam  Nursing note and vitals reviewed. Constitutional: She is oriented to person, place, and time. She appears well-developed and well-nourished. No distress.  HENT:  Head: Normocephalic and atraumatic.  Mouth/Throat: Oropharynx is clear and moist.  Eyes: EOM are normal. Pupils are equal, round, and  reactive to light.  Neck: Normal range of motion. Neck supple.  Cardiovascular: Normal rate and regular rhythm.   Pulmonary/Chest: Effort normal and breath sounds normal. No respiratory distress. She has no wheezes. She has no rales. She exhibits no tenderness.  Abdominal: Soft. Bowel sounds are normal. She exhibits no distension and no mass. There is no tenderness. There is no rebound and no guarding.  Musculoskeletal: Normal range of motion. She exhibits no edema. Tenderness: Mild R flank tenderness        No calf swelling or tenderness  Neurological: She is alert and oriented to person, place, and time.  Skin: Skin is warm and dry. No rash noted. No erythema.  Psychiatric: She has a normal mood and affect. Her behavior is normal.    ED Course  Procedures (including critical care time)  Labs Reviewed  CBC WITH  DIFFERENTIAL - Abnormal; Notable for the following:    RBC 5.35 (*)     Hemoglobin 15.1 (*)     All other components within normal limits  URINALYSIS, ROUTINE W REFLEX MICROSCOPIC - Abnormal; Notable for the following:    APPearance CLOUDY (*)     All other components within normal limits  COMPREHENSIVE METABOLIC PANEL   Ct Abdomen Pelvis Wo Contrast  12/11/2011  *RADIOLOGY REPORT*  Clinical Data: Right-sided flank and groin pain.  CT ABDOMEN AND PELVIS WITHOUT CONTRAST  Technique:  Multidetector CT imaging of the abdomen and pelvis was performed following the standard protocol without intravenous contrast.  Comparison: None.  Findings: No evidence of renal obstruction or calculi in the kidneys or ureters.  The bladder is unremarkable.  Unenhanced appearance of the solid organs is unremarkable including the liver, gallbladder, pancreas, spleen and adrenal glands.  The bowel is of normal caliber and shows no inflammation or obstruction.  No free fluid or abnormal fluid collections.  No soft tissue masses or enlarged lymph nodes are seen.  No hernias are identified.  Bony structures are unremarkable.  IMPRESSION: Normal CT of the abdomen and pelvis without contrast.   Original Report Authenticated By: Irish Lack, M.D.      1. Back pain       MDM   Pt with improved symptoms and negative workup. Pt advised to return for worsening symptoms, fever, SOB or any concerns       Loren Racer, MD 12/11/11 1217

## 2011-12-11 NOTE — ED Notes (Signed)
Patient transported to CT 

## 2011-12-17 ENCOUNTER — Emergency Department (HOSPITAL_COMMUNITY)
Admission: EM | Admit: 2011-12-17 | Discharge: 2011-12-17 | Disposition: A | Discharge status: Home / Self Care | Payer: BC Managed Care – PPO | Attending: Emergency Medicine | Admitting: Emergency Medicine

## 2011-12-17 ENCOUNTER — Emergency Department (HOSPITAL_COMMUNITY): Payer: BC Managed Care – PPO

## 2011-12-17 ENCOUNTER — Encounter (HOSPITAL_COMMUNITY): Payer: Self-pay | Admitting: Emergency Medicine

## 2011-12-17 DIAGNOSIS — F411 Generalized anxiety disorder: Secondary | ICD-10-CM | POA: Insufficient documentation

## 2011-12-17 DIAGNOSIS — M546 Pain in thoracic spine: Secondary | ICD-10-CM | POA: Insufficient documentation

## 2011-12-17 DIAGNOSIS — M549 Dorsalgia, unspecified: Secondary | ICD-10-CM

## 2011-12-17 DIAGNOSIS — Z79899 Other long term (current) drug therapy: Secondary | ICD-10-CM | POA: Insufficient documentation

## 2011-12-17 DIAGNOSIS — F17200 Nicotine dependence, unspecified, uncomplicated: Secondary | ICD-10-CM | POA: Insufficient documentation

## 2011-12-17 DIAGNOSIS — Z8719 Personal history of other diseases of the digestive system: Secondary | ICD-10-CM | POA: Insufficient documentation

## 2011-12-17 MED ORDER — ONDANSETRON 4 MG PO TBDP: ORAL_TABLET | ORAL | Status: DC

## 2011-12-17 MED ORDER — OXYCODONE-ACETAMINOPHEN 5-325 MG PO TABS: ORAL_TABLET | ORAL | Status: DC

## 2011-12-17 NOTE — ED Notes (Signed)
Pt states that she started having pain under her right shoulder blade and in mid of back last Saturday night. Pt states she was seen here several days ago and given Robaxin and ibuprofen but not helping.

## 2011-12-18 NOTE — ED Provider Notes (Signed)
Medical screening examination/treatment/procedure(s) were performed by non-physician practitioner and as supervising physician I was immediately available for consultation/collaboration.    Celene Kras, MD 12/18/11 [phone]

## 2011-12-18 NOTE — ED Provider Notes (Signed)
History     CSN: [id number]  Arrival date & time 12/17/11  1037   First MD Initiated Contact with Patient 12/17/11 1139      Chief Complaint  Patient presents with  . Back Pain    (Consider location/radiation/quality/duration/timing/severity/associated sxs/prior treatment) HPI  Robin Weaver is a 36 y.o. female complaining of persistent pain to the right posterior thoracic region. She was seen for similar several days ago and given a prescription for Robaxin which has not helped her. Pain is described as 8/10 and exacerbated by movement. She denies abdominal pain, nausea vomiting, chest pain, cough, SOB. Patient is an active smoker 6 pack year history.  Past Medical History  Diagnosis Date  . Anxiety   . TMJ disease     Past Surgical History  Procedure Date  . Abdominal hysterectomy   . Wrist surgery     No family history on file.  History  Substance Use Topics  . Smoking status: Current Every Day Smoker -- 0.5 packs/day for 12 years    Types: Cigarettes  . Smokeless tobacco: Never Used  . Alcohol Use: Yes     Comment: occasional    OB History    Grav Para Term Preterm Abortions TAB SAB Ect Mult Living   1 1 1       1       Review of Systems  Constitutional: Negative for fever.  Respiratory: Negative for shortness of breath.   Cardiovascular: Negative for chest pain.  Gastrointestinal: Negative for nausea, vomiting, abdominal pain and diarrhea.  Musculoskeletal: Positive for back pain.  All other systems reviewed and are negative.    Allergies  Vicodin  Home Medications   Current Outpatient Rx  Name  Route  Sig  Dispense  Refill  . ALBUTEROL SULFATE HFA 108 (90 BASE) MCG/ACT IN AERS   Inhalation   Inhale 2 puffs into the lungs every 6 (six) hours as needed. For asthma         . ASPIRIN-ACETAMINOPHEN-CAFFEINE 250-250-65 MG PO TABS   Oral   Take 2 tablets by mouth every 8 (eight) hours as needed. For pain.         Marland Kitchen CLONAZEPAM 0.5 MG PO  TABS   Oral   Take 0.5 mg by mouth 3 (three) times daily as needed. For anxiety.         . ESCITALOPRAM OXALATE 20 MG PO TABS   Oral   Take 20 mg by mouth daily.         . IBUPROFEN 600 MG PO TABS   Oral   Take 1 tablet (600 mg total) by mouth every 6 (six) hours as needed for pain.   30 tablet   0   . ONDANSETRON 4 MG PO TBDP      4mg  ODT q4 hours prn nausea/vomit   4 tablet   0   . OXYCODONE-ACETAMINOPHEN 5-325 MG PO TABS      1 to 2 tabs PO q6hrs  PRN for pain   15 tablet   0     BP 109/66  Pulse 73  Temp 97.6 F (36.4 C) (Oral)  Resp 20  SpO2 100%  Physical Exam  Nursing note and vitals reviewed. Constitutional: She is oriented to person, place, and time. She appears well-developed and well-nourished. No distress.  HENT:  Head: Normocephalic.  Mouth/Throat: Oropharynx is clear and moist.  Eyes: Conjunctivae normal and EOM are normal. Pupils are equal, round, and reactive to light.  Cardiovascular:  Normal rate, regular rhythm and intact distal pulses.   Pulmonary/Chest: Breath sounds normal. No stridor. No respiratory distress. She has no wheezes. She has no rales. She exhibits no tenderness.  Abdominal: Soft. Bowel sounds are normal. She exhibits no distension and no mass. There is no tenderness. There is no rebound and no guarding.  Musculoskeletal: Normal range of motion.       Mild tenderness to palpation of right paraspinal musculature of the thoracic region.  Neurological: She is alert and oriented to person, place, and time.  Psychiatric: She has a normal mood and affect.    ED Course  Procedures (including critical care time)  Labs Reviewed - No data to display Dg Chest 2 View  12/17/2011  *RADIOLOGY REPORT*  Clinical Data: Right upper back pain.  CHEST - 2 VIEW  Comparison: Two-view chest x-ray 02/01/2008, 01/22/2008.  Findings: Cardiomediastinal silhouette unremarkable, unchanged. Lungs clear.  Bronchovascular markings normal.  Pulmonary  vascularity normal.  No pneumothorax.  No pleural effusions.  Mild degenerative changes involving the thoracic spine.  No significant interval change.  IMPRESSION: No acute cardiopulmonary disease.  Stable examination.   Original Report Authenticated By: Hulan Saas, M.D.      1. Back pain       MDM  Persistent back pain after receiving muscle relaxers. This patient is a smoker and is reasonably chest x-ray.  Chest x-ray is clear. I will give her stronger pain medications encouraged outpatient followup. Return precautions given.  New Prescriptions   ONDANSETRON (ZOFRAN ODT) 4 MG DISINTEGRATING TABLET    4mg  ODT q4 hours prn nausea/vomit   OXYCODONE-ACETAMINOPHEN (PERCOCET/ROXICET) 5-325 MG PER TABLET    1 to 2 tabs PO q6hrs  PRN for pain          Wynetta Emery, PA-C 12/18/11 1528

## 2013-03-14 ENCOUNTER — Emergency Department (HOSPITAL_COMMUNITY)
Admission: EM | Admit: 2013-03-14 | Discharge: 2013-03-14 | Disposition: A | Discharge status: Home / Self Care | Payer: BC Managed Care – PPO | Attending: Emergency Medicine | Admitting: Emergency Medicine

## 2013-03-14 ENCOUNTER — Encounter (HOSPITAL_COMMUNITY): Payer: Self-pay | Admitting: Emergency Medicine

## 2013-03-14 DIAGNOSIS — F17200 Nicotine dependence, unspecified, uncomplicated: Secondary | ICD-10-CM | POA: Insufficient documentation

## 2013-03-14 DIAGNOSIS — Z8659 Personal history of other mental and behavioral disorders: Secondary | ICD-10-CM | POA: Insufficient documentation

## 2013-03-14 DIAGNOSIS — M26609 Unspecified temporomandibular joint disorder, unspecified side: Secondary | ICD-10-CM | POA: Insufficient documentation

## 2013-03-14 DIAGNOSIS — Z791 Long term (current) use of non-steroidal anti-inflammatories (NSAID): Secondary | ICD-10-CM | POA: Insufficient documentation

## 2013-03-14 MED ORDER — TRAMADOL HCL 50 MG PO TABS: 50.0000 mg | ORAL_TABLET | Freq: Four times a day (QID) | ORAL | Status: DC | PRN

## 2013-03-14 MED ORDER — IBUPROFEN 800 MG PO TABS: 800.0000 mg | ORAL_TABLET | Freq: Three times a day (TID) | ORAL | Status: DC

## 2013-03-14 MED ORDER — MORPHINE SULFATE 4 MG/ML IJ SOLN
4.0000 mg | Freq: Once | INTRAMUSCULAR | Status: AC
  Administered 2013-03-14: 4 mg via INTRAMUSCULAR
  Filled 2013-03-14: qty 1

## 2013-03-14 NOTE — ED Provider Notes (Signed)
CSN: [id number]     Arrival date & time 03/14/13  0414 History   First MD Initiated Contact with Patient 03/14/13 [phone]     Chief Complaint  Patient presents with  . Jaw Pain     (Consider location/radiation/quality/duration/timing/severity/associated sxs/prior Treatment) HPI History provided by pt.   Pt woke yesterday am with mild pain in left teeth and jaw that she attributed to grinding the previous night.  Early this morning she woke w/ severe pain in left jaw.  No aggravating features but seems to be relieved by applying pressure.  No associated fever, ear pain, sore throat, toothache, skin changes, CP/SOB.  Denies trauma.  Per prior chart, pt presented w/ similar sx in 2013 and was diagnosed w/ L-sided TMJ syndrome.   No other pertinent PMH. Past Medical History  Diagnosis Date  . Anxiety   . TMJ disease    Past Surgical History  Procedure Laterality Date  . Abdominal hysterectomy    . Wrist surgery     History reviewed. No pertinent family history. History  Substance Use Topics  . Smoking status: Current Every Day Smoker -- 0.50 packs/day for 12 years    Types: Cigarettes  . Smokeless tobacco: Never Used  . Alcohol Use: Yes     Comment: occasional   OB History   Grav Para Term Preterm Abortions TAB SAB Ect Mult Living   1 1 1       1      Review of Systems  All other systems reviewed and are negative.      Allergies  Vicodin  Home Medications   Current Outpatient Rx  Name  Route  Sig  Dispense  Refill  . albuterol (PROVENTIL HFA;VENTOLIN HFA) 108 (90 BASE) MCG/ACT inhaler   Inhalation   Inhale 2 puffs into the lungs every 6 (six) hours as needed. For asthma         . ibuprofen (ADVIL,MOTRIN) 600 MG tablet   Oral   Take 1 tablet (600 mg total) by mouth every 6 (six) hours as needed for pain.   30 tablet   0   . naproxen sodium (ANAPROX) 220 MG tablet   Oral   Take 220 mg by mouth 2 (two) times daily with a meal.         . HYDROmorphone HCl  (DILAUDID PO)   Oral   Take 1 tablet by mouth every 6 (six) hours as needed (pain).          BP 136/86  Pulse 87  Temp(Src) 97.5 F (36.4 C) (Oral)  Resp 20  Ht 5\' 9"  (1.753 m)  Wt 201 lb (91.173 kg)  BMI 29.67 kg/m2  SpO2 99% Physical Exam  Nursing note and vitals reviewed. Constitutional: She is oriented to person, place, and time. She appears well-developed and well-nourished. No distress.  Uncomfortable appearing  HENT:  Head: Normocephalic and atraumatic.  No skin changes L side of face.  To tenderness of temple.  No tenderness of jaw/TMJ.  No tenderness of dentition.  No tenderness of L ear and EAC and TM w/ nml appearance.  Full ROM of jaw w/out pain.    Eyes:  Normal appearance  Neck: Normal range of motion.  Cardiovascular: Normal rate and regular rhythm.   Pulmonary/Chest: Effort normal and breath sounds normal. No respiratory distress.  Musculoskeletal: Normal range of motion.  Lymphadenopathy:    She has no cervical adenopathy.  Neurological: She is alert and oriented to person, place, and time.  Skin: Skin is warm and dry. No rash noted.  Psychiatric: She has a normal mood and affect. Her behavior is normal.    ED Course  Procedures (including critical care time) Labs Review Labs Reviewed - No data to display Imaging Review No results found.  EKG Interpretation   None       MDM   Final diagnoses:  TMJ (temporomandibular joint syndrome)    38yo F w/ h/o TMJ, presents w/ non-traumatic L jaw pain since yesterday.  No associated sx.  On exam, afebrile, uncomfortable appearing, no skin changes, nml L ear, no temple/dental/jaw tenderness, full ROM of jaw w/out increase in pain.  Will treat w/ IM morphine and reassess.    Pain improved.  On re-examination, patient feels popping w/ ROM of jaw.  Suspect TMJ.  D/c'd home w/ ultram and 800mg  ibuprofen.  She has a PCP to f/u with.    Remer Macho, PA-C 03/14/13 [phone]

## 2013-03-14 NOTE — ED Notes (Signed)
Pt reports jaw pain that started yesterday. Pain located to L side of face near ear. Pain is constant pain that throbs. Pt denies any other pain at this time. Pt alert and oriented.

## 2013-03-14 NOTE — Discharge Instructions (Signed)
Take tramadol as needed for pain.  Do not drive within four hours of taking this medication (may cause drowsiness or confusion).   Take ibuprofen as well; up to 800mg  three times a day with food.  Apply a heating pad or ice pack to your jaw and eat soft foods until pain has resolved. Follow up with your primary care doctor if pain has not started to improve by Friday.   You may return to the ER if symptoms worsen or you have any other concerns.    Temporomandibular Problems  Temporomandibular joint (TMJ) dysfunction means there are problems with the joint between your jaw and your skull. This is a joint lined by cartilage like other joints in your body but also has a small disc in the joint which keeps the bones from rubbing on each other. These joints are like other joints and can get inflamed (sore) from arthritis and other problems. When this joint gets sore, it can cause headaches and pain in the jaw and the face. CAUSES  Usually the arthritic types of problems are caused by soreness in the joint. Soreness in the joint can also be caused by overuse. This may come from grinding your teeth. It may also come from mis-alignment in the joint. DIAGNOSIS Diagnosis of this condition can often be made by history and exam. Sometimes your caregiver may need X-rays or an MRI scan to determine the exact cause. It may be necessary to see your dentist to determine if your teeth and jaws are lined up correctly. TREATMENT  Most of the time this problem is not serious; however, sometimes it can persist (become chronic). When this happens medications that will cut down on inflammation (soreness) help. Sometimes a shot of cortisone into the joint will be helpful. If your teeth are not aligned it may help for your dentist to make a splint for your mouth that can help this problem. If no physical problems can be found, the problem may come from tension. If tension is found to be the cause, biofeedback or relaxation  techniques may be helpful. HOME CARE INSTRUCTIONS   Later in the day, applications of ice packs may be helpful. Ice can be used in a plastic bag with a towel around it to prevent frostbite to skin. This may be used about every 2 hours for 20 to 30 minutes, as needed while awake, or as directed by your caregiver.  Only take over-the-counter or prescription medicines for pain, discomfort, or fever as directed by your caregiver.  If physical therapy was prescribed, follow your caregiver's directions.  Wear mouth appliances as directed if they were given. Document Released: 09/28/2000 Document Revised: 03/28/2011 Document Reviewed: 01/06/2008 [facility] Patient Information 2014 [place], [place].

## 2013-03-14 NOTE — ED Provider Notes (Signed)
Medical screening examination/treatment/procedure(s) were performed by non-physician practitioner and as supervising physician I was immediately available for consultation/collaboration.  EKG Interpretation   None        [name], MD 03/14/13 [phone]

## 2013-03-16 ENCOUNTER — Encounter (HOSPITAL_COMMUNITY): Payer: Self-pay | Admitting: Emergency Medicine

## 2013-03-16 ENCOUNTER — Emergency Department (HOSPITAL_COMMUNITY)
Admission: EM | Admit: 2013-03-16 | Discharge: 2013-03-16 | Disposition: A | Discharge status: Home / Self Care | Payer: BC Managed Care – PPO | Attending: Emergency Medicine | Admitting: Emergency Medicine

## 2013-03-16 DIAGNOSIS — K029 Dental caries, unspecified: Secondary | ICD-10-CM | POA: Insufficient documentation

## 2013-03-16 DIAGNOSIS — K047 Periapical abscess without sinus: Secondary | ICD-10-CM | POA: Insufficient documentation

## 2013-03-16 DIAGNOSIS — F17200 Nicotine dependence, unspecified, uncomplicated: Secondary | ICD-10-CM | POA: Insufficient documentation

## 2013-03-16 DIAGNOSIS — Z791 Long term (current) use of non-steroidal anti-inflammatories (NSAID): Secondary | ICD-10-CM | POA: Insufficient documentation

## 2013-03-16 DIAGNOSIS — F411 Generalized anxiety disorder: Secondary | ICD-10-CM | POA: Insufficient documentation

## 2013-03-16 MED ORDER — ONDANSETRON 4 MG PO TBDP: 4.0000 mg | ORAL_TABLET | Freq: Three times a day (TID) | ORAL | Status: DC | PRN

## 2013-03-16 MED ORDER — CLINDAMYCIN HCL 150 MG PO CAPS: 150.0000 mg | ORAL_CAPSULE | Freq: Three times a day (TID) | ORAL | Status: DC

## 2013-03-16 NOTE — Discharge Instructions (Signed)
Abscessed Tooth  An abscessed tooth is an infection around your tooth. It may be caused by holes or damage to the tooth (cavity) or a dental disease. An abscessed tooth causes mild to very bad pain in and around the tooth. See your dentist right away if you have tooth or gum pain.  HOME CARE   Take your medicine as told. Finish it even if you start to feel better.   Do not drive after taking pain medicine.   Rinse your mouth (gargle) often with salt water ( teaspoon salt in 8 ounces of warm water).   Do not apply heat to the outside of your face.  GET HELP RIGHT AWAY IF:    You have a temperature by mouth above 102 F (38.9 C), not controlled by medicine.   You have chills and a very bad headache.   You have problems breathing or swallowing.   Your mouth will not open.   You develop puffiness (swelling) on the neck or around the eye.   Your pain is not helped by medicine.   Your pain is getting worse instead of better.  MAKE SURE YOU:    Understand these instructions.   Will watch your condition.   Will get help right away if you are not doing well or get worse.  Document Released: 06/22/2007 Document Revised: 03/28/2011 Document Reviewed: 04/13/2010  [employer] Patient Information 2014 [employer].

## 2013-03-16 NOTE — ED Notes (Signed)
Pt from home c/o L swollen jaw. Pt reports that she was here 2 days ago for TMJ, but had no swelling. Pt states that swelling just happened this am. Pt is A&O and in NAD

## 2013-03-16 NOTE — ED Provider Notes (Signed)
CSN: [id number]     Arrival date & time 03/16/13  0709 History   First MD Initiated Contact with Patient 03/16/13 [phone]     Chief Complaint  Patient presents with  . Facial Swelling     (Consider location/radiation/quality/duration/timing/severity/associated sxs/prior Treatment) HPI Comments: Pt states that she was seen 2 days ago with left sided jaw pain and was treated with ultram for tmj. Pt states that over night she developed swelling to her left jaw. Denies fever:pt state that she has been vomiting since she left here. Pt states that the ultram does help with the pain a little  The history is provided by the patient. No language interpreter was used.    Past Medical History  Diagnosis Date  . Anxiety   . TMJ disease    Past Surgical History  Procedure Laterality Date  . Abdominal hysterectomy    . Wrist surgery     No family history on file. History  Substance Use Topics  . Smoking status: Current Every Day Smoker -- 0.50 packs/day for 12 years    Types: Cigarettes  . Smokeless tobacco: Never Used  . Alcohol Use: Yes     Comment: occasional   OB History   Grav Para Term Preterm Abortions TAB SAB Ect Mult Living   1 1 1       1      Review of Systems  Constitutional: Negative.   Respiratory: Negative.   Cardiovascular: Negative.       Allergies  Vicodin  Home Medications   Current Outpatient Rx  Name  Route  Sig  Dispense  Refill  . albuterol (PROVENTIL HFA;VENTOLIN HFA) 108 (90 BASE) MCG/ACT inhaler   Inhalation   Inhale 2 puffs into the lungs every 6 (six) hours as needed. For asthma         . clindamycin (CLEOCIN) 150 MG capsule   Oral   Take 1 capsule (150 mg total) by mouth 3 (three) times daily.   21 capsule   0   . HYDROmorphone HCl (DILAUDID PO)   Oral   Take 1 tablet by mouth every 6 (six) hours as needed (pain).         Marland Kitchen ibuprofen (ADVIL,MOTRIN) 600 MG tablet   Oral   Take 1 tablet (600 mg total) by mouth every 6 (six) hours as  needed for pain.   30 tablet   0   . ibuprofen (ADVIL,MOTRIN) 800 MG tablet   Oral   Take 1 tablet (800 mg total) by mouth 3 (three) times daily.   12 tablet   0   . naproxen sodium (ANAPROX) 220 MG tablet   Oral   Take 220 mg by mouth 2 (two) times daily with a meal.         . ondansetron (ZOFRAN ODT) 4 MG disintegrating tablet   Oral   Take 1 tablet (4 mg total) by mouth every 8 (eight) hours as needed for nausea or vomiting.   20 tablet   0   . traMADol (ULTRAM) 50 MG tablet   Oral   Take 1 tablet (50 mg total) by mouth every 6 (six) hours as needed.   15 tablet   0    BP 117/80  Pulse 89  Temp(Src) 98.4 F (36.9 C) (Oral)  Resp 14  SpO2 93% Physical Exam  Nursing note and vitals reviewed. Constitutional: She is oriented to person, place, and time. She appears well-developed and well-nourished.  HENT:  Right Ear: External  ear normal.  Left Ear: External ear normal.  Mouth/Throat: Oropharynx is clear and moist.  Swelling noted to the left lower jaw. Mild decay and previous filling noted to teeth in lower dentition.   Cardiovascular: Normal rate and regular rhythm.   Pulmonary/Chest: Effort normal and breath sounds normal.  Musculoskeletal: Normal range of motion.  Neurological: She is alert and oriented to person, place, and time.    ED Course  Procedures (including critical care time) Labs Review Labs Reviewed - No data to display Imaging Review No results found.   EKG Interpretation None      MDM   Final diagnoses:  Dental abscess    Will treat with clinda and refer to dentist. Pt given zofran for vomiting    Glendell Docker, NP 03/16/13 [phone]

## 2013-03-16 NOTE — ED Provider Notes (Signed)
Medical screening examination/treatment/procedure(s) were performed by non-physician practitioner and as supervising physician I was immediately available for consultation/collaboration.   EKG Interpretation None        Charles B. Sheldon, MD 03/16/13 0903 

## 2013-07-02 ENCOUNTER — Other Ambulatory Visit: Payer: Self-pay

## 2013-07-02 DIAGNOSIS — Z1231 Encounter for screening mammogram for malignant neoplasm of breast: Secondary | ICD-10-CM

## 2013-07-12 ENCOUNTER — Encounter (INDEPENDENT_AMBULATORY_CARE_PROVIDER_SITE_OTHER): Payer: Self-pay

## 2013-07-12 ENCOUNTER — Ambulatory Visit
Admission: RE | Admit: 2013-07-12 | Discharge: 2013-07-12 | Disposition: A | Discharge status: Home / Self Care | Payer: BC Managed Care – PPO | Source: Ambulatory Visit

## 2013-07-12 DIAGNOSIS — Z1231 Encounter for screening mammogram for malignant neoplasm of breast: Secondary | ICD-10-CM

## 2013-11-18 ENCOUNTER — Encounter (HOSPITAL_COMMUNITY): Payer: Self-pay | Admitting: Emergency Medicine

## 2014-09-26 ENCOUNTER — Ambulatory Visit (INDEPENDENT_AMBULATORY_CARE_PROVIDER_SITE_OTHER): Payer: BLUE CROSS/BLUE SHIELD | Admitting: Podiatry

## 2014-09-26 ENCOUNTER — Telehealth: Payer: Self-pay | Admitting: *Deleted

## 2014-09-26 ENCOUNTER — Ambulatory Visit (INDEPENDENT_AMBULATORY_CARE_PROVIDER_SITE_OTHER): Payer: BLUE CROSS/BLUE SHIELD

## 2014-09-26 ENCOUNTER — Encounter: Payer: Self-pay | Admitting: Podiatry

## 2014-09-26 VITALS — BP 122/80 | HR 73 | Resp 16

## 2014-09-26 DIAGNOSIS — M79673 Pain in unspecified foot: Secondary | ICD-10-CM

## 2014-09-26 DIAGNOSIS — B372 Candidiasis of skin and nail: Secondary | ICD-10-CM | POA: Diagnosis not present

## 2014-09-26 DIAGNOSIS — M722 Plantar fascial fibromatosis: Secondary | ICD-10-CM

## 2014-09-26 MED ORDER — TERBINAFINE HCL 250 MG PO TABS: ORAL_TABLET | ORAL | Status: DC

## 2014-09-26 MED ORDER — TRIAMCINOLONE ACETONIDE 10 MG/ML IJ SUSP
10.0000 mg | Freq: Once | INTRAMUSCULAR | Status: AC
  Administered 2014-09-26: 10 mg

## 2014-09-26 NOTE — Patient Instructions (Signed)

## 2014-09-26 NOTE — Telephone Encounter (Signed)
I think she is on diclofenec so does not need another antinflammatory

## 2014-09-26 NOTE — Telephone Encounter (Addendum)
Pt states she was seen today and Terbinafine was called to her pharmacy, but the other medication Dr. Paulla Dolly discussed was not called.  I called pt she stated she figured it out, she's on the Diclofenac which will help the inflammation in her feet too.

## 2014-09-26 NOTE — Progress Notes (Signed)
   Subjective:    Patient ID: Robin Weaver, female    DOB: [birth year], 40 y.o.   MRN: [id number]  HPI Pt presents with pain in her right heel lasting one month. Pain is worse after resting the ambualting. She has tried nsaids, is currently on diclofenac for back issues. She has tried icing but no pain relief   Review of Systems  All other systems reviewed and are negative.      Objective:   Physical Exam        Assessment & Plan:

## 2014-09-29 NOTE — Progress Notes (Signed)
Subjective:     Patient ID: Robin Weaver, female   DOB: 1975/05/14, 39 y.o.   MRN: [id number]  HPI patient presents with pain in the plantar right heel of one-month duration. States it's worse after resting and that she's tried anti-inflammatory these and icing with no relief   Review of Systems  All other systems reviewed and are negative.      Objective:   Physical Exam  Constitutional: She is oriented to person, place, and time.  Cardiovascular: Intact distal pulses.   Musculoskeletal: Normal range of motion.  Neurological: She is oriented to person, place, and time.  Skin: Skin is warm.  Nursing note and vitals reviewed.  neurovascular status found to be intact muscle strength adequate with range of motion subtalar midtarsal joint within normal limits. Patient's found to have exquisite discomfort plantar aspect right heel at the insertional point tendon into the calcaneus with inflammation and fluid buildup and is also noted to have moderate depression of the arch upon weightbearing. Patient has good digital perfusion is well oriented 3 with no equinus condition noted     Assessment:     Plantar fasciitis of an acute nature right heel at the insertion to calcaneus    Plan:     H&P and x-rays reviewed with patient and today I injected the plantar fascial insertion 3 mg Kenalog 5 mg Xylocaine and applied fascial brace with instructions on usage. Gave instructions on physical therapy supportive shoes and reappoint to recheck

## 2014-10-03 ENCOUNTER — Ambulatory Visit (INDEPENDENT_AMBULATORY_CARE_PROVIDER_SITE_OTHER): Payer: BLUE CROSS/BLUE SHIELD | Admitting: Podiatry

## 2014-10-03 ENCOUNTER — Encounter: Payer: Self-pay | Admitting: Podiatry

## 2014-10-03 VITALS — BP 103/69 | HR 76 | Resp 16

## 2014-10-03 DIAGNOSIS — M722 Plantar fascial fibromatosis: Secondary | ICD-10-CM | POA: Diagnosis not present

## 2014-10-03 DIAGNOSIS — M79673 Pain in unspecified foot: Secondary | ICD-10-CM

## 2014-10-03 MED ORDER — TRIAMCINOLONE ACETONIDE 10 MG/ML IJ SUSP
10.0000 mg | Freq: Once | INTRAMUSCULAR | Status: AC
  Administered 2014-10-03: 10 mg

## 2014-10-05 NOTE — Progress Notes (Signed)
Subjective:     Patient ID: Robin Weaver, female   DOB: 05-Oct-1975, 39 y.o.   MRN: [id number]  HPI patient states my heel is feeling quite a bit better but there is one spot but still is very sore   Review of Systems     Objective:   Physical Exam Neurovascular status intact muscle strength adequate with continued discomfort plantar aspect right heel but improved from previous    Assessment:     Plantar fasciitis right improved but still painful    Plan:     Reinjected the plantar fascia 3 mg Kenalog 5 mg Xylocaine and instructed on physical therapy supportive shoes and reappoint to recheck

## 2015-10-28 ENCOUNTER — Encounter (HOSPITAL_COMMUNITY): Payer: Self-pay

## 2015-10-28 ENCOUNTER — Emergency Department (HOSPITAL_COMMUNITY)
Admission: EM | Admit: 2015-10-28 | Discharge: 2015-10-28 | Disposition: A | Discharge status: Home / Self Care | Payer: BLUE CROSS/BLUE SHIELD | Attending: Emergency Medicine | Admitting: Emergency Medicine

## 2015-10-28 ENCOUNTER — Emergency Department (HOSPITAL_COMMUNITY): Payer: BLUE CROSS/BLUE SHIELD

## 2015-10-28 DIAGNOSIS — M546 Pain in thoracic spine: Secondary | ICD-10-CM

## 2015-10-28 DIAGNOSIS — F17210 Nicotine dependence, cigarettes, uncomplicated: Secondary | ICD-10-CM | POA: Insufficient documentation

## 2015-10-28 DIAGNOSIS — Z79899 Other long term (current) drug therapy: Secondary | ICD-10-CM | POA: Diagnosis not present

## 2015-10-28 DIAGNOSIS — R0789 Other chest pain: Secondary | ICD-10-CM | POA: Insufficient documentation

## 2015-10-28 DIAGNOSIS — R079 Chest pain, unspecified: Secondary | ICD-10-CM

## 2015-10-28 DIAGNOSIS — Z8541 Personal history of malignant neoplasm of cervix uteri: Secondary | ICD-10-CM | POA: Insufficient documentation

## 2015-10-28 LAB — BASIC METABOLIC PANEL
Anion gap: 8 (ref 5–15)
BUN: 13 mg/dL (ref 6–20)
CO2: 24 mmol/L (ref 22–32)
Calcium: 9 mg/dL (ref 8.9–10.3)
Chloride: 106 mmol/L (ref 101–111)
Creatinine, Ser: 0.77 mg/dL (ref 0.44–1.00)
GFR calc Af Amer: >60 mL/min (ref >60)
GFR calc non Af Amer: >60 mL/min (ref >60)
Glucose, Bld: 103 mg/dL — ABNORMAL HIGH (ref 65–99)
Potassium: 4.1 mmol/L (ref 3.5–5.1)
Sodium: 138 mmol/L (ref 135–145)

## 2015-10-28 LAB — CBC WITH DIFFERENTIAL/PLATELET
Basophils Absolute: 0.1 10*3/uL (ref 0.0–0.1)
Basophils Relative: 1 %
Eosinophils Absolute: 0.2 10*3/uL (ref 0.0–0.7)
Eosinophils Relative: 4 %
HCT: 43.4 % (ref 36.0–46.0)
Hemoglobin: 14.7 g/dL (ref 12.0–15.0)
Lymphocytes Relative: 30 %
Lymphs Abs: 1.7 10*3/uL (ref 0.7–4.0)
MCH: 28.2 pg (ref 26.0–34.0)
MCHC: 33.9 g/dL (ref 30.0–36.0)
MCV: 83.1 fL (ref 78.0–100.0)
Monocytes Absolute: 0.5 10*3/uL (ref 0.1–1.0)
Monocytes Relative: 8 %
Neutro Abs: 3.3 10*3/uL (ref 1.7–7.7)
Neutrophils Relative %: 57 %
Platelets: 241 10*3/uL (ref 150–400)
RBC: 5.22 MIL/uL — ABNORMAL HIGH (ref 3.87–5.11)
RDW: 13.6 % (ref 11.5–15.5)
WBC: 5.7 10*3/uL (ref 4.0–10.5)

## 2015-10-28 LAB — D-DIMER, QUANTITATIVE (NOT AT ARMC): D-Dimer, Quant: 0.27 ug/mL-FEU (ref 0.00–0.50)

## 2015-10-28 LAB — I-STAT TROPONIN, ED: Troponin i, poc: 0 ng/mL (ref 0.00–0.08)

## 2015-10-28 MED ORDER — NAPROXEN 500 MG PO TABS: 500.0000 mg | ORAL_TABLET | Freq: Two times a day (BID) | ORAL | 0 refills | Status: DC

## 2015-10-28 MED ORDER — KETOROLAC TROMETHAMINE 30 MG/ML IJ SOLN
30.0000 mg | Freq: Once | INTRAMUSCULAR | Status: AC
  Administered 2015-10-28: 30 mg via INTRAVENOUS
  Filled 2015-10-28: qty 1

## 2015-10-28 NOTE — ED Notes (Signed)
PT DISCHARGED. INSTRUCTIONS AND PRESCRIPTION GIVEN. AAOX4. PT IN NO APPARENT DISTRESS OR PAIN. THE OPPORTUNITY TO ASK QUESTIONS WAS PROVIDED. 

## 2015-10-28 NOTE — ED Triage Notes (Signed)
She c/o intermittent mid chest pain radiating at times to upper back since Sat. She is in no distress.

## 2015-10-28 NOTE — ED Notes (Signed)
Patient transported to X-ray 

## 2015-10-28 NOTE — ED Provider Notes (Signed)
[facility] DEPT Provider Note   CSN: [id number] Arrival date & time: 10/28/15  0857     History   Chief Complaint Chief Complaint  Patient presents with  . Chest Pain    HPI Robin Weaver is a 40 y.o. female.  HPI  40 year old female presents with chest pain and back pain over the past 5 days. Patient states the chest pain feels like a tightness and sharp pain that comes and goes. However her back just under her shoulder blades diffusely has been counseling hurting since onset. It feels aching and sharp. Nothing in particular makes it better. Sometimes inspiration makes it worse. No coughing or shortness of breath. Radiation of the pain. No abdominal pain. Has not noticed any leg swelling or hemoptysis. No prior history of DVT. Is not on birth control and has had a hysterectomy due to prior cervical cancer which is now in remission. Has taken leftover tramadol and Robaxin from a prior injury with some relief. No exertional component. Chronically has palpitations and feeling like her heart is beating out of her chest due to anxiety, had a recurrence of that this morning and so she decided to get checked out. No urinary symptoms. No hx of HTN, HLD, DM or early CAD in the famiily. Does smoke.  Past Medical History:  Diagnosis Date  . Anxiety   . TMJ disease     Patient Active Problem List   Diagnosis Date Noted  . Cervical cancer ([place]) 04/04/2011    Past Surgical History:  Procedure Laterality Date  . ABDOMINAL HYSTERECTOMY    . WRIST SURGERY      OB History    Gravida Para Term Preterm AB Living   1 1 1     1    SAB TAB Ectopic Multiple Live Births                   Home Medications    Prior to Admission medications   Medication Sig Start Date End Date Taking? Authorizing Provider  albuterol (PROVENTIL HFA;VENTOLIN HFA) 108 (90 BASE) MCG/ACT inhaler Inhale 2 puffs into the lungs every 6 (six) hours as needed. For asthma   Yes Historical Provider, MD    methocarbamol (ROBAXIN) 500 MG tablet Take 500-1,000 mg by mouth every 6 (six) hours as needed for muscle spasms.  08/16/15  Yes Historical Provider, MD  traMADol (ULTRAM) 50 MG tablet Take 1 tablet (50 mg total) by mouth every 6 (six) hours as needed. Patient taking differently: Take 50 mg by mouth every 6 (six) hours as needed for moderate pain.  03/14/13  Yes Catherine Schinlever, PA-C  ALPRAZolam (XANAX) 0.25 MG tablet Take 0.25 mg by mouth 3 (three) times daily as needed for anxiety. 05/11/09   Historical Provider, MD  escitalopram (LEXAPRO) 10 MG tablet Take 10 mg by mouth daily. 05/11/09   Historical Provider, MD  naproxen (NAPROSYN) 500 MG tablet Take 1 tablet (500 mg total) by mouth 2 (two) times daily with a meal. 10/28/15   Sherwood Gambler, MD    Family History No family history on file.  Social History Social History  Substance Use Topics  . Smoking status: Current Every Day Smoker    Packs/day: 0.50    Years: 12.00    Types: Cigarettes  . Smokeless tobacco: Never Used  . Alcohol use Yes     Comment: occasional     Allergies   Vicodin [hydrocodone-acetaminophen]   Review of Systems Review of Systems  Respiratory: Negative for  cough and shortness of breath.   Cardiovascular: Positive for chest pain.  Gastrointestinal: Negative for abdominal pain.  Genitourinary: Negative for dysuria.  Musculoskeletal: Positive for back pain.  All other systems reviewed and are negative.    Physical Exam Updated Vital Signs BP 117/77   Pulse 64   Temp 98.6 F (37 C) (Oral)   Resp 15   Ht 5\' 9"  (1.753 m)   Wt 203 lb (92.1 kg)   SpO2 95%   BMI 29.98 kg/m   Physical Exam  Constitutional: She is oriented to person, place, and time. She appears well-developed and well-nourished.  HENT:  Head: Normocephalic and atraumatic.  Right Ear: External ear normal.  Left Ear: External ear normal.  Nose: Nose normal.  Eyes: Right eye exhibits no discharge. Left eye exhibits no  discharge.  Cardiovascular: Normal rate, regular rhythm and normal heart sounds.   Pulses:      Radial pulses are 2+ on the right side, and 2+ on the left side.  Pulmonary/Chest: Effort normal and breath sounds normal. She exhibits no tenderness.  Abdominal: Soft. There is no tenderness. There is no CVA tenderness.  Musculoskeletal: She exhibits no edema.       Thoracic back: She exhibits tenderness. Bony tenderness: mild.       Back:  Neurological: She is alert and oriented to person, place, and time.  Skin: Skin is warm and dry.  Nursing note and vitals reviewed.    ED Treatments / Results  Labs (all labs ordered are listed, but only abnormal results are displayed) Labs Reviewed  BASIC METABOLIC PANEL - Abnormal; Notable for the following:       Result Value   Glucose, Bld 103 (*)    All other components within normal limits  CBC WITH DIFFERENTIAL/PLATELET - Abnormal; Notable for the following:    RBC 5.22 (*)    All other components within normal limits  D-DIMER, QUANTITATIVE (NOT AT [facility])  I-STAT TROPOININ, ED    EKG  EKG Interpretation  Date/Time:  Wednesday October 28 2015 09:05:02 EDT Ventricular Rate:  88 PR Interval:    QRS Duration: 83 QT Interval:  363 QTC Calculation: 440 R Axis:   60 Text Interpretation:  Sinus rhythm Low voltage, precordial leads Borderline T wave abnormalities No old tracing to compare Confirmed by Dylann Gallier MD, Wm Sahagun [phone]) on 10/28/2015 9:15:45 AM Also confirmed by Regenia Skeeter MD, Farhad Burleson [phone]), editor [place], Joelene Millin [phone])  on 10/28/2015 9:46:44 AM       EKG Interpretation  Date/Time:  Wednesday October 28 2015 10:51:39 EDT Ventricular Rate:  68 PR Interval:    QRS Duration: 82 QT Interval:  408 QTC Calculation: 434 R Axis:   55 Text Interpretation:  Sinus rhythm Low voltage, precordial leads borderline T wave changes no significant change since earlier in the day Confirmed by [place] MD, Tallan Sandoz [phone]) on 10/28/2015 11:02:16  AM       Radiology Dg Chest 2 View  Result Date: 10/28/2015 CLINICAL DATA:  Left mid chest pain EXAM: CHEST  2 VIEW COMPARISON:  12/17/2011 FINDINGS: The heart size and mediastinal contours are within normal limits. Both lungs are clear. The visualized skeletal structures are unremarkable. IMPRESSION: No active cardiopulmonary disease. Electronically Signed   By: Kathreen Devoid   On: 10/28/2015 10:01   Dg Thoracic Spine W/swimmers  Result Date: 10/28/2015 CLINICAL DATA:  Acute midline thoracic back pain EXAM: THORACIC SPINE - 3 VIEWS COMPARISON:  None. FINDINGS: There is no evidence of thoracic spine  fracture. Dextrocurvature of the thoracolumbar spine. Alignment is normal. No other significant bone abnormalities are identified. There is mild thoracic spine spondylosis. IMPRESSION: No acute osseous injury of the thoracic spine. Electronically Signed   By: Kathreen Devoid   On: 10/28/2015 10:02    Procedures Procedures (including critical care time)  Medications Ordered in ED Medications  ketorolac (TORADOL) 30 MG/ML injection 30 mg (30 mg Intravenous Given 10/28/15 1022)     Initial Impression / Assessment and Plan / ED Course  I have reviewed the triage vital signs and the nursing notes.  Pertinent labs & imaging results that were available during my care of the patient were reviewed by me and considered in my medical decision making (see chart for details).  Clinical Course  Comment By Time  Atypical chest/back pain. Suspicion for Dissection is quite low. Will get ddimer, labs, troponin, cxr and thoracic xray. Sherwood Gambler, MD 10/11 [phone]  Toradol has helped pain some. Workup was unremarkable including negative troponin, d-dimer, and her chest/thoracic x-rays. This is probably musculoskeletal. Patient will be treated with naproxen in addition to her other medicines. She is requesting to be restarted on her anxiety medicines which include Lexapro and Xanax. Has not taken these for over  one year. I discussed we can do the Lexapro today but she will need a PCP for long-term care for possible benzodiazepines. She agrees to this and will work on finding a PCP. Discussed return precautions. Sherwood Gambler, MD 10/11 1055    Given that she is on tramadol, will not prescribe Lexapro and encourage her to follow-up with PCP. Naproxen for pain. I have very low suspicion for ACS, PE, or dissection. Borderline T wave changes but no old to compare to and no acute changes while in the ED.  Final Clinical Impressions(s) / ED Diagnoses   Final diagnoses:  Acute midline thoracic back pain  Nonspecific chest pain    New Prescriptions New Prescriptions   NAPROXEN (NAPROSYN) 500 MG TABLET    Take 1 tablet (500 mg total) by mouth 2 (two) times daily with a meal.     Sherwood Gambler, MD 10/28/15 1102

## 2015-10-28 NOTE — ED Notes (Signed)
MD at bedside. EDP [place]

## 2015-10-28 NOTE — ED Notes (Signed)
MD at bedside. 

## 2015-11-20 ENCOUNTER — Encounter: Payer: Self-pay | Admitting: *Deleted

## 2015-11-20 ENCOUNTER — Telehealth: Payer: Self-pay | Admitting: *Deleted

## 2015-11-20 NOTE — Telephone Encounter (Addendum)
Pt states she has a weird question. Left message to call back with weird question. Pt states she needs a note to wear athletic shoes during work to support her arches as treatment for plantar fasciitis. Pt states she will pick up next week.

## 2016-02-12 ENCOUNTER — Other Ambulatory Visit: Payer: Self-pay | Admitting: Obstetrics and Gynecology

## 2016-02-12 DIAGNOSIS — Z1231 Encounter for screening mammogram for malignant neoplasm of breast: Secondary | ICD-10-CM

## 2016-02-22 ENCOUNTER — Ambulatory Visit
Admission: RE | Admit: 2016-02-22 | Discharge: 2016-02-22 | Disposition: A | Discharge status: Home / Self Care | Payer: BLUE CROSS/BLUE SHIELD | Source: Ambulatory Visit | Attending: Obstetrics and Gynecology | Admitting: Obstetrics and Gynecology

## 2016-02-22 DIAGNOSIS — Z1231 Encounter for screening mammogram for malignant neoplasm of breast: Secondary | ICD-10-CM

## 2016-02-22 HISTORY — DX: Mastodynia: N64.4

## 2017-01-31 ENCOUNTER — Other Ambulatory Visit: Payer: Self-pay | Admitting: Obstetrics and Gynecology

## 2017-01-31 DIAGNOSIS — Z139 Encounter for screening, unspecified: Secondary | ICD-10-CM

## 2017-02-27 ENCOUNTER — Ambulatory Visit
Admission: RE | Admit: 2017-02-27 | Discharge: 2017-02-27 | Disposition: A | Discharge status: Home / Self Care | Payer: BLUE CROSS/BLUE SHIELD | Source: Ambulatory Visit | Attending: Obstetrics and Gynecology | Admitting: Obstetrics and Gynecology

## 2017-02-27 DIAGNOSIS — Z139 Encounter for screening, unspecified: Secondary | ICD-10-CM

## 2017-05-15 ENCOUNTER — Telehealth: Payer: Self-pay | Admitting: Obstetrics and Gynecology

## 2017-05-15 NOTE — Telephone Encounter (Signed)
Patient called and cancelled her referral appointment with Dr. Quincy Simmonds on 05/31/17 due to a work conflict. She said she'll need to call back and reschedule.

## 2017-05-31 ENCOUNTER — Encounter: Payer: BLUE CROSS/BLUE SHIELD | Admitting: Obstetrics and Gynecology

## 2017-06-07 ENCOUNTER — Encounter: Payer: BLUE CROSS/BLUE SHIELD | Admitting: Obstetrics and Gynecology

## 2017-07-06 ENCOUNTER — Encounter: Payer: BLUE CROSS/BLUE SHIELD | Admitting: Obstetrics and Gynecology

## 2017-08-24 ENCOUNTER — Encounter: Payer: Self-pay | Admitting: Obstetrics and Gynecology

## 2017-08-24 ENCOUNTER — Ambulatory Visit: Payer: BLUE CROSS/BLUE SHIELD | Admitting: Obstetrics and Gynecology

## 2017-08-24 ENCOUNTER — Other Ambulatory Visit: Payer: Self-pay

## 2017-08-24 VITALS — BP 112/70 | HR 80 | Wt 226.8 lb

## 2017-08-24 DIAGNOSIS — Z1272 Encounter for screening for malignant neoplasm of vagina: Secondary | ICD-10-CM

## 2017-08-24 DIAGNOSIS — E282 Polycystic ovarian syndrome: Secondary | ICD-10-CM

## 2017-08-24 DIAGNOSIS — Z8541 Personal history of malignant neoplasm of cervix uteri: Secondary | ICD-10-CM

## 2017-08-24 DIAGNOSIS — R102 Pelvic and perineal pain: Secondary | ICD-10-CM | POA: Diagnosis not present

## 2017-08-24 DIAGNOSIS — L680 Hirsutism: Secondary | ICD-10-CM

## 2017-08-24 DIAGNOSIS — G43009 Migraine without aura, not intractable, without status migrainosus: Secondary | ICD-10-CM

## 2017-08-24 MED ORDER — DROSPIRENONE-ETHINYL ESTRADIOL 3-0.02 MG PO TABS: ORAL_TABLET | ORAL | 0 refills | Status: DC

## 2017-08-24 NOTE — Patient Instructions (Signed)
Oral Contraception Information Oral contraceptive pills (OCPs) are medicines taken to prevent pregnancy. OCPs work by preventing the ovaries from releasing eggs. The hormones in OCPs also cause the cervical mucus to thicken, preventing the sperm from entering the uterus. The hormones also cause the uterine lining to become thin, not allowing a fertilized egg to attach to the inside of the uterus. OCPs are highly effective when taken exactly as prescribed. However, OCPs do not prevent sexually transmitted diseases (STDs). Safe sex practices, such as using condoms along with the pill, can help prevent STDs. Before taking the pill, you may have a physical exam and Pap test. Your health care provider may order blood tests. The health care provider will make sure you are a good candidate for oral contraception. Discuss with your health care provider the possible side effects of the OCP you may be prescribed. When starting an OCP, it can take 2 to 3 months for the body to adjust to the changes in hormone levels in your body. Types of oral contraception  The combination pill-This pill contains estrogen and progestin (synthetic progesterone) hormones. The combination pill comes in 21-day, 28-day, or 91-day packs. Some types of combination pills are meant to be taken continuously (365-day pills). With 21-day packs, you do not take pills for 7 days after the last pill. With 28-day packs, the pill is taken every day. The last 7 pills are without hormones. Certain types of pills have more than 21 hormone-containing pills. With 91-day packs, the first 84 pills contain both hormones, and the last 7 pills contain no hormones or contain estrogen only.  The minipill-This pill contains the progesterone hormone only. The pill is taken every day continuously. It is very important to take the pill at the same time each day. The minipill comes in packs of 28 pills. All 28 pills contain the hormone. Advantages of oral  contraceptive pills  Decreases premenstrual symptoms.  Treats menstrual period cramps.  Regulates the menstrual cycle.  Decreases a heavy menstrual flow.  May treatacne, depending on the type of pill.  Treats abnormal uterine bleeding.  Treats polycystic ovarian syndrome.  Treats endometriosis.  Can be used as emergency contraception. Things that can make oral contraceptive pills less effective OCPs can be less effective if:  You forget to take the pill at the same time every day.  You have a stomach or intestinal disease that lessens the absorption of the pill.  You take OCPs with other medicines that make OCPs less effective, such as antibiotics, certain HIV medicines, and some seizure medicines.  You take expired OCPs.  You forget to restart the pill on day 7, when using the packs of 21 pills.  Risks associated with oral contraceptive pills Oral contraceptive pills can sometimes cause side effects, such as:  Headache.  Nausea.  Breast tenderness.  Irregular bleeding or spotting.  Combination pills are also associated with a small increased risk of:  Blood clots.  Heart attack.  Stroke.  This information is not intended to replace advice given to you by your health care provider. Make sure you discuss any questions you have with your health care provider. Document Released: 03/26/2002 Document Revised: 06/11/2015 Document Reviewed: 06/24/2012 Elsevier Interactive Patient Education  2018 [employer]vier Inc[employer]  

## 2017-08-24 NOTE — Progress Notes (Addendum)
42 y.o. G1P1001 Significant OtherCaucasianF here for referral for a consultation from Lyla Glassing, [place] for possible PCOS.  She has a h/o a hysterectomy (TLH/BS) for invasive cervical cancer noted on leep, final pathology with CIN III no residual cancer.  She was noted to have PCOS on an ultrasound done in the past.  She c/o recurrent "ovarinan cysts". She has intermittent pain in her lower abdomen/pelvis (can be one or both sides). The pain can be 1-3 x a month, occasionally goes a few months with pain. Last episode of pain was a few weeks ago. The pain lasts for a couple of days, gradually gets worse, gets severe (doubles her over) for a few hours, then resolves in a day or two.  H/O irregular menses prior to her hysterectomy.  Sexually active, no pain. BM 2-3 x a day, no change. No pain.  She has had urinary frequency since her hysterectomy, voids normal amounts, no pain.  She has some hair growth on her chin, worse since her hysterectomy. No acne. Weight has increased, she has gained 86 lbs since 2011. She is watching what she eats, low carb, exercises. Normal TSH this year.     No LMP recorded. Patient has had a hysterectomy.  History of endometrial cancer.        Sexually active: Yes.    The current method of family planning is status post hysterectomy.    Exercising: Yes.    walking Smoker:  no  Health Maintenance: Pap:  2018 WNL History of abnormal Pap:  Yes, 2010 abnormal pap MMG:  02/27/2017 Birads 1 negative Colonoscopy:  May 2019 polyp, follow up in 5 years BMD:  2018 WNL TDaP:  Unsure Gardasil: Never   reports that she has quit smoking. Her smoking use included cigarettes. She has a 6.00 pack-year smoking history. She has never used smokeless tobacco. She reports that she drinks alcohol. She reports that she does not use drugs. Rare ETOH. She is a clinic care specialist. One son, 44.   Past Medical History:  Diagnosis Date  . Anxiety   . Breast pain, left    left  diffuse breast pain x 10/2015  . Cervical cancer ([place])    Treated with TLH/BS, pathology from specimen only with CIN III  . Migraine without aura   . TMJ disease     Past Surgical History:  Procedure Laterality Date  . ROBOTIC ASSISTED TOTAL HYSTERECTOMY     With BS, had a ureteral injury, treated with a stent.   . WRIST SURGERY      Current Outpatient Medications  Medication Sig Dispense Refill  . albuterol (PROVENTIL HFA;VENTOLIN HFA) 108 (90 BASE) MCG/ACT inhaler Inhale 2 puffs into the lungs every 6 (six) hours as needed. For asthma    . ALPRAZolam (XANAX) 0.25 MG tablet Take 0.25 mg by mouth 3 (three) times daily as needed for anxiety.    . cholecalciferol (VITAMIN D) 1000 units tablet Take 1,000 Units by mouth daily.    Marland Kitchen escitalopram (LEXAPRO) 20 MG tablet Take 20 mg by mouth daily.    . meloxicam (MOBIC) 15 MG tablet Take 15 mg by mouth daily.    . SUMAtriptan (IMITREX) 50 MG tablet Take 50 mg by mouth every 2 (two) hours as needed for migraine. May repeat in 2 hours if headache persists or recurs.    . topiramate (TOPAMAX) 25 MG capsule Take 25 mg by mouth 2 (two) times daily. Take 3 tablets daily     No  current facility-administered medications for this visit.     Family History  Problem Relation Age of Onset  . Hypertension Mother   . Hypertension Father   . Bladder Cancer Father   . Colon cancer Maternal Grandmother     Review of Systems  Constitutional: Negative.   HENT: Negative.   Eyes: Negative.   Respiratory: Negative.   Cardiovascular: Negative.   Gastrointestinal: Negative.   Endocrine: Negative.   Genitourinary:       Recurring ovarian cysts  Musculoskeletal: Negative.   Skin: Negative.   Allergic/Immunologic: Negative.   Neurological: Negative.   Hematological: Negative.   Psychiatric/Behavioral: Negative.     Exam:   BP 112/70 (BP Location: Right Arm, Patient Position: Sitting)   Pulse 80   Wt 226 lb 12.8 oz (102.9 kg)   BMI 33.49 kg/m    Weight change: @WEIGHTCHANGE @ Height:      Ht Readings from Last 3 Encounters:  10/28/15 5\' 9"  (1.753 m)  03/14/13 5\' 9"  (1.753 m)  04/04/11 5\' 9"  (1.753 m)    General appearance: alert, cooperative and appears stated age Skin: mild hirsutism noted on her chin and lower abdomen Head: Normocephalic, without obvious abnormality, atraumatic Neck: no adenopathy, supple, symmetrical, trachea midline and thyroid normal to inspection and palpation Lungs: clear to auscultation bilaterally Cardiovascular: regular rate and rhythm Abdomen: soft, non-tender; non distended,  no masses,  no organomegaly Extremities: extremities normal, atraumatic, no cyanosis or edema Skin: Skin color, texture, turgor normal. No rashes or lesions Lymph nodes: Cervical, supraclavicular, and axillary nodes normal. No abnormal inguinal nodes palpated Neurologic: Grossly normal   Pelvic: External genitalia:  no lesions              Urethra:  normal appearing urethra with no masses, tenderness or lesions              Bartholins and Skenes: normal                 Vagina: normal appearing vagina with normal color and discharge, no lesions              Cervix: absent               Bimanual Exam:  Uterus:  uterus absent              Adnexa: no mass, fullness, tenderness               Rectovaginal: Confirms               Anus:  normal sphincter tone, no lesions  Chaperone was present for exam.  A:  Intermittent pelvic/abdominal pain, could be ovarian cysts (can't tell without evaluation)  H/O PCOS ovaries on ultrasound several years ago  C/O new hirsutism  C/O weight gain  H/O cervical cancer  P:   She reports recent negative TSH and testing for diabetes with her primary. Will get a copy of her labs from her primary  Hirsutism labs  Given numbers for weight loss clinics  Discussed the option of OCP's to suppress ovaries  No contraindications to OCP's, risks reviewed. Will start Yaz, discussed increased risk of  clots  F/U in 3 months  Pap with hpv. I would recommend yearly paps  CC: Elenore Paddy, FNP Note sent  09/06/17 addendum: Labs reviewed In 1/18 she had a negative pap with negative hpv In 1/19 she had a normal HgbA1C In 4/19 she had normal TFT's, liver panel and glucose

## 2017-08-26 LAB — DHEA-SULFATE: DHEA-SO4: 129 ug/dL (ref 57.3–279.2)

## 2017-08-26 LAB — 17-HYDROXYPROGESTERONE: 17-Hydroxyprogesterone: 70 ng/dL

## 2017-08-26 LAB — TESTOSTERONE: Testosterone: 22 ng/dL (ref 8–48)

## 2017-08-29 LAB — CYTOLOGY - PAP

## 2017-11-29 ENCOUNTER — Other Ambulatory Visit: Payer: Self-pay | Admitting: Obstetrics and Gynecology

## 2017-11-30 NOTE — Telephone Encounter (Signed)
Left message to call [place] at [phone].  Patient needs an appointment for 3 month follow up prior to refills being given.

## 2017-11-30 NOTE — Telephone Encounter (Signed)
Patient scheduled follow up for 12/27/17. Patient stated that she has a 3 week alternating schedule and that is the next available day for her. Patient is requesting 1 pack to get her to that appointment.

## 2017-12-06 NOTE — Telephone Encounter (Signed)
Refill request received from [facility] for the following:   Medication refill request: OCP Last OV:  08-24-17 JJ  Next OV: 12-27-17  Last MMG (if hormonal medication request): 02-27-17 density B/BIRADS 1 negative  Refill authorized: please advise.  Medication pended for #28 as patient has appointment on 12-27-17 for medication recheck.

## 2017-12-27 ENCOUNTER — Ambulatory Visit: Payer: BLUE CROSS/BLUE SHIELD | Admitting: Obstetrics and Gynecology

## 2017-12-28 NOTE — Progress Notes (Signed)
GYNECOLOGY  VISIT   HPI: 42 y.o.   Significant Other White or Caucasian Not Hispanic or Latino  female   G1P1001 with No LMP recorded. Patient has had a hysterectomy.   here for   3 month OCP follow up. She was started on OCP's secondary to recurrent ovarian cysts and pelvic pain. She was started on Yaz secondary to hirsutism (normal lab work). She has felt better on the Yaz, marked improvement in her pain. She is skipping the placebo pills. She has only had one episode of pain since she was last here, more mild (when she missed a few pills) She has lost 17 lbs since this summer.  She has had a headache about one x a month, not migraines. Overall she is feeling well.   GYNECOLOGIC HISTORY: No LMP recorded. Patient has had a hysterectomy. Contraception: Hysterectomy  Menopausal hormone therapy: none         OB History    Gravida  1   Para  1   Term  1   Preterm  0   AB  0   Living  1     SAB  0   TAB  0   Ectopic  0   Multiple  0   Live Births  1              Patient Active Problem List   Diagnosis Date Noted  . Migraine without aura   . Cervical cancer ([place]) 04/04/2011    Past Medical History:  Diagnosis Date  . Anxiety   . Breast pain, left    left diffuse breast pain x 10/2015  . Cervical cancer ([place])    Treated with TLH/BS, pathology from specimen only with CIN III  . Migraine without aura   . TMJ disease     Past Surgical History:  Procedure Laterality Date  . ROBOTIC ASSISTED TOTAL HYSTERECTOMY     With BS, had a ureteral injury, treated with a stent.   . WRIST SURGERY      Current Outpatient Medications  Medication Sig Dispense Refill  . albuterol (PROVENTIL HFA;VENTOLIN HFA) 108 (90 BASE) MCG/ACT inhaler Inhale 2 puffs into the lungs every 6 (six) hours as needed. For asthma    . ALPRAZolam (XANAX) 0.25 MG tablet Take 0.25 mg by mouth 3 (three) times daily as needed for anxiety.    . cholecalciferol (VITAMIN D) 1000 units tablet Take 1,000  Units by mouth daily.    . drospirenone-ethinyl estradiol (GIANVI) 3-0.02 MG tablet TAKE CONTINUOUSLY, SKIP THE LAST 4 DAYS OF PLACEBO 28 tablet 0  . escitalopram (LEXAPRO) 20 MG tablet Take 20 mg by mouth daily.    . meloxicam (MOBIC) 15 MG tablet Take 15 mg by mouth daily.    . SUMAtriptan (IMITREX) 50 MG tablet Take 50 mg by mouth every 2 (two) hours as needed for migraine. May repeat in 2 hours if headache persists or recurs.    . topiramate (TOPAMAX) 25 MG capsule Take 25 mg by mouth 2 (two) times daily. Take 3 tablets daily     No current facility-administered medications for this visit.      ALLERGIES: Vicodin [hydrocodone-acetaminophen]  Family History  Problem Relation Age of Onset  . Hypertension Mother   . Hypertension Father   . Bladder Cancer Father   . Colon cancer Maternal Grandmother     Social History   Socioeconomic History  . Marital status: Significant Other    Spouse name: Not on  file  . Number of children: Not on file  . Years of education: Not on file  . Highest education level: Not on file  Occupational History  . Not on file  Social Needs  . Financial resource strain: Not on file  . Food insecurity:    Worry: Not on file    Inability: Not on file  . Transportation needs:    Medical: Not on file    Non-medical: Not on file  Tobacco Use  . Smoking status: Former Smoker    Packs/day: 0.50    Years: 12.00    Pack years: 6.00    Types: Cigarettes  . Smokeless tobacco: Never Used  Substance and Sexual Activity  . Alcohol use: Yes    Comment: occasional  . Drug use: No  . Sexual activity: Yes    Birth control/protection: Surgical    Comment: Hysterectomy  Lifestyle  . Physical activity:    Days per week: Not on file    Minutes per session: Not on file  . Stress: Not on file  Relationships  . Social connections:    Talks on phone: Not on file    Gets together: Not on file    Attends religious service: Not on file    Active member of club  or organization: Not on file    Attends meetings of clubs or organizations: Not on file    Relationship status: Not on file  . Intimate partner violence:    Fear of current or ex partner: Not on file    Emotionally abused: Not on file    Physically abused: Not on file    Forced sexual activity: Not on file  Other Topics Concern  . Not on file  Social History Narrative  . Not on file    Review of Systems  Constitutional: Negative.   HENT: Negative.   Eyes: Negative.   Respiratory: Positive for cough.        Recent bronchitis  Cardiovascular: Negative.   Gastrointestinal: Negative.   Genitourinary:       Loss of sexual interest    Musculoskeletal: Negative.   Skin: Negative.   Neurological: Positive for headaches.  Endo/Heme/Allergies: Negative.   Psychiatric/Behavioral: Negative.     PHYSICAL EXAMINATION:    BP 116/62 (BP Location: Right Arm, Patient Position: Sitting, Cuff Size: Large)   Pulse 74   Resp 14   Ht 5\' 9"  (1.753 m)   Wt 213 lb 4 oz (96.7 kg)   BMI 31.49 kg/m     General appearance: alert, cooperative and appears stated age  ASSESSMENT Pelvic pain, improved on OCP's H/O hysterectomy    PLAN Continue OCP's F/U in 8/20 for annual exam   An After Visit Summary was printed and given to the patient.

## 2017-12-29 ENCOUNTER — Ambulatory Visit: Payer: BLUE CROSS/BLUE SHIELD | Admitting: Obstetrics and Gynecology

## 2017-12-29 ENCOUNTER — Encounter: Payer: Self-pay | Admitting: Obstetrics and Gynecology

## 2017-12-29 ENCOUNTER — Other Ambulatory Visit: Payer: Self-pay

## 2017-12-29 VITALS — BP 116/62 | HR 74 | Resp 14 | Ht 69.0 in | Wt 213.2 lb

## 2017-12-29 DIAGNOSIS — R102 Pelvic and perineal pain: Secondary | ICD-10-CM | POA: Diagnosis not present

## 2017-12-29 DIAGNOSIS — Z3041 Encounter for surveillance of contraceptive pills: Secondary | ICD-10-CM | POA: Diagnosis not present

## 2017-12-29 MED ORDER — DROSPIRENONE-ETHINYL ESTRADIOL 3-0.02 MG PO TABS: ORAL_TABLET | ORAL | 2 refills | Status: DC

## 2018-02-12 ENCOUNTER — Other Ambulatory Visit: Payer: Self-pay | Admitting: Obstetrics and Gynecology

## 2018-02-12 DIAGNOSIS — Z1231 Encounter for screening mammogram for malignant neoplasm of breast: Secondary | ICD-10-CM

## 2018-02-26 DIAGNOSIS — M47814 Spondylosis without myelopathy or radiculopathy, thoracic region: Secondary | ICD-10-CM | POA: Diagnosis not present

## 2018-03-13 DIAGNOSIS — H8110 Benign paroxysmal vertigo, unspecified ear: Secondary | ICD-10-CM | POA: Diagnosis not present

## 2018-03-14 ENCOUNTER — Ambulatory Visit
Admission: RE | Admit: 2018-03-14 | Discharge: 2018-03-14 | Disposition: A | Discharge status: Home / Self Care | Payer: BLUE CROSS/BLUE SHIELD | Source: Ambulatory Visit | Attending: Obstetrics and Gynecology | Admitting: Obstetrics and Gynecology

## 2018-03-14 DIAGNOSIS — Z1231 Encounter for screening mammogram for malignant neoplasm of breast: Secondary | ICD-10-CM

## 2018-03-28 DIAGNOSIS — F419 Anxiety disorder, unspecified: Secondary | ICD-10-CM | POA: Diagnosis not present

## 2018-03-28 DIAGNOSIS — G43909 Migraine, unspecified, not intractable, without status migrainosus: Secondary | ICD-10-CM | POA: Diagnosis not present

## 2018-03-28 DIAGNOSIS — Z760 Encounter for issue of repeat prescription: Secondary | ICD-10-CM | POA: Diagnosis not present

## 2018-04-15 DIAGNOSIS — F419 Anxiety disorder, unspecified: Secondary | ICD-10-CM | POA: Diagnosis not present

## 2018-05-01 ENCOUNTER — Telehealth: Payer: Self-pay | Admitting: Obstetrics and Gynecology

## 2018-05-01 NOTE — Telephone Encounter (Signed)
Her distant h/o cervical cancer would not be a reason for her to be on leave. Although she is on albuterol, asthma is not on her medical history. Can you please help her by reaching out to her primary MD office. Thanks

## 2018-05-01 NOTE — Progress Notes (Signed)
Erroneous encounter

## 2018-05-01 NOTE — Telephone Encounter (Signed)
Spoke with patient. Patient states that due to her history of cancer and asthma she had to go out on natural disaster leave due to her high risk with COVID 19. States she needs a letter for her HR department stating that she has a history or cancer and asthma diagnosed as a child. Has contacted her PCP office 3 times with no assistance. Asking if Dr.Jertson can write letter for pick up for her to provide to HR. Letter to [place] for review.

## 2018-05-01 NOTE — Telephone Encounter (Signed)
Call to patient's PCP office Dr. Elenore Paddy with [facility]. Spoke with receptionist via nurse line asking to send a message regarding patient's request for a letter as we are unable to write this for her. Advised the patient has been contacting their office with no return calls. Per office they will call the patient to discuss. Spoke with patient and advised PCP office states they are going to call her to discuss needs for her letter.   Routing to provider and will close encounter.

## 2018-05-01 NOTE — Telephone Encounter (Signed)
Patient is asking for a note from [place] stating that she has a history of cervical cancer and asthma.

## 2018-05-02 ENCOUNTER — Telehealth: Payer: Self-pay | Admitting: Obstetrics and Gynecology

## 2018-05-02 NOTE — Telephone Encounter (Signed)
Patient would like to speak with nurse. She spoke with [facility] Tuesday.

## 2018-05-02 NOTE — Telephone Encounter (Signed)
Spoke with patient. Patient states that her PCP will not write her letter for natural disaster leave as she needs to be seen in their office by her new provider first. Patient's original PCP left the practice. States they are not seeing routine visits at this time due to COVID 19. Patient is aware we are unable to write letter to her HR department as reviewed with Dr.Jertson on 05/01/2018. Patient will reach out to previous PCP and her HR department to discuss.  Routing to provider and will close encounter.

## 2018-05-03 DIAGNOSIS — J45909 Unspecified asthma, uncomplicated: Secondary | ICD-10-CM | POA: Diagnosis not present

## 2018-06-08 DIAGNOSIS — M47814 Spondylosis without myelopathy or radiculopathy, thoracic region: Secondary | ICD-10-CM | POA: Diagnosis not present

## 2018-06-08 DIAGNOSIS — Z760 Encounter for issue of repeat prescription: Secondary | ICD-10-CM | POA: Diagnosis not present

## 2018-06-08 DIAGNOSIS — J329 Chronic sinusitis, unspecified: Secondary | ICD-10-CM | POA: Diagnosis not present

## 2018-06-20 DIAGNOSIS — J3089 Other allergic rhinitis: Secondary | ICD-10-CM | POA: Diagnosis not present

## 2018-06-20 DIAGNOSIS — M47814 Spondylosis without myelopathy or radiculopathy, thoracic region: Secondary | ICD-10-CM | POA: Diagnosis not present

## 2018-06-20 DIAGNOSIS — G43909 Migraine, unspecified, not intractable, without status migrainosus: Secondary | ICD-10-CM | POA: Diagnosis not present

## 2018-06-20 DIAGNOSIS — F419 Anxiety disorder, unspecified: Secondary | ICD-10-CM | POA: Diagnosis not present

## 2018-08-06 DIAGNOSIS — J209 Acute bronchitis, unspecified: Secondary | ICD-10-CM | POA: Diagnosis not present

## 2018-08-08 DIAGNOSIS — Z20828 Contact with and (suspected) exposure to other viral communicable diseases: Secondary | ICD-10-CM | POA: Diagnosis not present

## 2018-09-21 DIAGNOSIS — F419 Anxiety disorder, unspecified: Secondary | ICD-10-CM | POA: Diagnosis not present

## 2018-09-21 DIAGNOSIS — M47814 Spondylosis without myelopathy or radiculopathy, thoracic region: Secondary | ICD-10-CM | POA: Diagnosis not present

## 2018-09-21 DIAGNOSIS — J4520 Mild intermittent asthma, uncomplicated: Secondary | ICD-10-CM | POA: Diagnosis not present

## 2018-09-21 DIAGNOSIS — G43909 Migraine, unspecified, not intractable, without status migrainosus: Secondary | ICD-10-CM | POA: Diagnosis not present

## 2018-11-08 ENCOUNTER — Other Ambulatory Visit: Payer: Self-pay | Admitting: Obstetrics and Gynecology

## 2018-11-12 ENCOUNTER — Other Ambulatory Visit: Payer: Self-pay | Admitting: Obstetrics and Gynecology

## 2018-11-12 ENCOUNTER — Encounter: Payer: Self-pay | Admitting: Obstetrics and Gynecology

## 2018-11-12 MED ORDER — DROSPIRENONE-ETHINYL ESTRADIOL 3-0.02 MG PO TABS: ORAL_TABLET | ORAL | 2 refills | Status: DC

## 2018-11-12 NOTE — Telephone Encounter (Signed)
See 11/12/18 refill encounter.   Spoke with patient, notified of OCP refill. Patient verbalizes understanding and is agreeable.   Encounter closed.

## 2018-11-12 NOTE — Telephone Encounter (Signed)
Patient returned call to schedule aex. Scheduled for 01/16/19.

## 2018-11-12 NOTE — Telephone Encounter (Signed)
Patient sent the following correspondence through [place].  Good afternoon Dr. Talbert Nan!  I had requested a refill on my birth control pills through my pharmacy but haven't heard anything back.  I think I may be due for an appt with you but was wondering about a refill due to [place].  I am high risk due to having very severe asthma and my cancer history.  If I have to come to an appt I will but would definitely would prefer not to due to my status.  Please let me know ASAP what I need to do.  Thank you!  Robin Weaver

## 2018-11-12 NOTE — Telephone Encounter (Signed)
Spoke with pt. Pt states when called [place], has no refills left for BCP. Pt states only picking up 1 package per month. Needs refill until 12/20 for next AEX.   Medication refill request: GIANVI Last AEX:  12/19 Next AEX: 12/20 Last MMG (if hormonal medication request): 03/14/18 Refill authorized: #[id number] with 2RF pending today.

## 2018-12-05 DIAGNOSIS — M47814 Spondylosis without myelopathy or radiculopathy, thoracic region: Secondary | ICD-10-CM | POA: Diagnosis not present

## 2018-12-05 DIAGNOSIS — Z23 Encounter for immunization: Secondary | ICD-10-CM | POA: Diagnosis not present

## 2018-12-05 DIAGNOSIS — Z760 Encounter for issue of repeat prescription: Secondary | ICD-10-CM | POA: Diagnosis not present

## 2018-12-05 DIAGNOSIS — G43909 Migraine, unspecified, not intractable, without status migrainosus: Secondary | ICD-10-CM | POA: Diagnosis not present

## 2018-12-26 DIAGNOSIS — J45909 Unspecified asthma, uncomplicated: Secondary | ICD-10-CM | POA: Diagnosis not present

## 2018-12-26 DIAGNOSIS — R29898 Other symptoms and signs involving the musculoskeletal system: Secondary | ICD-10-CM | POA: Diagnosis not present

## 2018-12-26 DIAGNOSIS — F419 Anxiety disorder, unspecified: Secondary | ICD-10-CM | POA: Insufficient documentation

## 2019-01-04 DIAGNOSIS — M6281 Muscle weakness (generalized): Secondary | ICD-10-CM | POA: Diagnosis not present

## 2019-01-04 DIAGNOSIS — R531 Weakness: Secondary | ICD-10-CM | POA: Diagnosis not present

## 2019-01-14 NOTE — Progress Notes (Signed)
43 y.o. G1P1001 Significant Other White or Caucasian Not Hispanic or Latino female here for annual exam.  H/O hysterectomy for stage 1A1 grade 2 cervical cancer in 3/11.     She is on Yaz secondary to recurrent ovarian cysts and pelvic pain. [facility] was chosen secondary to hirsutism (normal lab work). No pain. No worsening of hirsutism.  She c/o low libido. Able to enjoy sex and have an orgasm.  No LMP recorded. Patient has had a hysterectomy.          Sexually active: Yes.    The current method of family planning is status post hysterectomy.    Exercising: Yes.    Hiking ,walking and some cardio Smoker:  no  Health Maintenance: Pap:  08/24/2017 WNL, negative hpv, 2018 WNL History of abnormal Pap:  Yes, 2010 abnormal pap MMG:  03/14/2018 Birads 1 negative Colonoscopy:  May 2019 polyp, follow up in 5 years BMD:  2018 WNL TDaP:  Unsure Gardasil: Never   reports that she has quit smoking. Her smoking use included cigarettes. She has a 6.00 pack-year smoking history. She has never used smokeless tobacco. She reports current alcohol use. She reports that she does not use drugs. 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 Son is grown, Radiation protection practitioner.   Past Medical History:  Diagnosis Date  . Anxiety   . Breast pain, left    left diffuse breast pain x 10/2015  . Cervical cancer ([place])    Treated with TLH/BS, pathology from specimen only with CIN III  . Migraine without aura   . TMJ disease     Past Surgical History:  Procedure Laterality Date  . ROBOTIC ASSISTED TOTAL HYSTERECTOMY     With BS, had a ureteral injury, treated with a stent.   . WRIST SURGERY      Current Outpatient Medications  Medication Sig Dispense Refill  . albuterol (PROVENTIL HFA;VENTOLIN HFA) 108 (90 BASE) MCG/ACT inhaler Inhale 2 puffs into the lungs every 6 (six) hours as needed. For asthma    . ALPRAZolam (XANAX) 0.25 MG tablet Take 0.5 mg by mouth as needed for anxiety.     . budesonide-formoterol  (SYMBICORT) 160-4.5 MCG/ACT inhaler Inhale 2 puffs into the lungs 2 (two) times daily.    . drospirenone-ethinyl estradiol (GIANVI) 3-0.02 MG tablet TAKE CONTINUOUSLY, SKIP THE LAST 4 DAYS OF PLACEBO 1 Package 2  . DULoxetine (CYMBALTA) 60 MG capsule Take 60 mg by mouth daily.    . meloxicam (MOBIC) 15 MG tablet Take 15 mg by mouth daily.    . SUMAtriptan (IMITREX) 50 MG tablet Take 50 mg by mouth every 2 (two) hours as needed for migraine. May repeat in 2 hours if headache persists or recurs.    . topiramate (TOPAMAX) 25 MG capsule Take 25 mg by mouth 2 (two) times daily. Take 3 tablets daily     No current facility-administered medications for this visit.    Family History  Problem Relation Age of Onset  . Hypertension Mother   . Hypertension Father   . Bladder Cancer Father   . Colon cancer Maternal Grandmother     Review of Systems  Constitutional: Negative.   HENT: Negative.   Eyes: Negative.   Respiratory: Negative.   Cardiovascular: Negative.   Gastrointestinal: Negative.   Endocrine: Negative.   Genitourinary: Negative.   Musculoskeletal: Negative.   Skin: Negative.   Allergic/Immunologic: Negative.   Neurological: Negative.   Hematological: Negative.   Psychiatric/Behavioral: Negative.  Depression  Depression is okay  Exam:   BP 122/64   Pulse (!) 102   Temp (!) 97.4 F (36.3 C)   Ht 5' 8.25" (1.734 m)   Wt 199 lb 9.6 oz (90.5 kg)   SpO2 97%   BMI 30.13 kg/m   Weight change: @WEIGHTCHANGE @ Height:   Height: 5' 8.25" (173.4 cm)  Ht Readings from Last 3 Encounters:  01/16/19 5' 8.25" (1.734 m)  12/29/17 5\' 9"  (1.753 m)  10/28/15 5\' 9"  (1.753 m)    General appearance: alert, cooperative and appears stated age Head: Normocephalic, without obvious abnormality, atraumatic Neck: no adenopathy, supple, symmetrical, trachea midline and thyroid normal to inspection and palpation Lungs: clear to auscultation bilaterally Cardiovascular: regular rate and  rhythm Breasts: normal appearance, no masses or tenderness Abdomen: soft, non-tender; non distended,  no masses,  no organomegaly Extremities: extremities normal, atraumatic, no cyanosis or edema Skin: Skin color, texture, turgor normal. No rashes or lesions Lymph nodes: Cervical, supraclavicular, and axillary nodes normal. No abnormal inguinal nodes palpated Neurologic: Grossly normal   Pelvic: External genitalia:  no lesions              Urethra:  normal appearing urethra with no masses, tenderness or lesions              Bartholins and Skenes: normal                 Vagina: normal appearing vagina with normal color and discharge, no lesions              Cervix: absent               Bimanual Exam:  Uterus:  uterus absent              Adnexa: no mass, fullness, tenderness               Rectovaginal: Confirms               Anus:  normal sphincter tone, no lesions  Gae Dry chaperoned for the exam.  A:  Well Woman with normal exam  H/O cervical cancer, s/p hysterectomy in 3/11  Low libido  P:   Pap with reflex hpv testing  Mammogram in 2/21  Labs with primary  TDAP today  Information on libido given  Discussed breast self exam  Discussed calcium and vit D intake

## 2019-01-16 ENCOUNTER — Encounter: Payer: Self-pay | Admitting: Obstetrics and Gynecology

## 2019-01-16 ENCOUNTER — Other Ambulatory Visit: Payer: Self-pay

## 2019-01-16 ENCOUNTER — Other Ambulatory Visit (HOSPITAL_COMMUNITY)
Admission: RE | Admit: 2019-01-16 | Discharge: 2019-01-16 | Disposition: A | Discharge status: Home / Self Care | Payer: BC Managed Care – PPO | Source: Ambulatory Visit | Attending: Obstetrics and Gynecology | Admitting: Obstetrics and Gynecology

## 2019-01-16 ENCOUNTER — Ambulatory Visit (INDEPENDENT_AMBULATORY_CARE_PROVIDER_SITE_OTHER): Payer: BC Managed Care – PPO | Admitting: Obstetrics and Gynecology

## 2019-01-16 VITALS — BP 122/64 | HR 102 | Temp 97.4°F | Ht 68.25 in | Wt 199.6 lb

## 2019-01-16 DIAGNOSIS — Z8541 Personal history of malignant neoplasm of cervix uteri: Secondary | ICD-10-CM | POA: Insufficient documentation

## 2019-01-16 DIAGNOSIS — Z1272 Encounter for screening for malignant neoplasm of vagina: Secondary | ICD-10-CM

## 2019-01-16 DIAGNOSIS — J45909 Unspecified asthma, uncomplicated: Secondary | ICD-10-CM | POA: Insufficient documentation

## 2019-01-16 DIAGNOSIS — Z01419 Encounter for gynecological examination (general) (routine) without abnormal findings: Secondary | ICD-10-CM

## 2019-01-16 DIAGNOSIS — R6882 Decreased libido: Secondary | ICD-10-CM

## 2019-01-16 DIAGNOSIS — Z23 Encounter for immunization: Secondary | ICD-10-CM | POA: Diagnosis not present

## 2019-01-16 MED ORDER — DROSPIRENONE-ETHINYL ESTRADIOL 3-0.02 MG PO TABS: ORAL_TABLET | ORAL | 3 refills | Status: DC

## 2019-01-16 NOTE — Addendum Note (Signed)
Addended by: Yates Decamp on: 01/16/2019 09:21 AM   Modules accepted: Orders

## 2019-01-16 NOTE — Patient Instructions (Signed)

## 2019-01-17 LAB — CYTOLOGY - PAP: Diagnosis: NEGATIVE

## 2019-02-28 ENCOUNTER — Ambulatory Visit: Payer: BC Managed Care – PPO | Admitting: Neurology

## 2019-02-28 ENCOUNTER — Other Ambulatory Visit: Payer: Self-pay

## 2019-02-28 ENCOUNTER — Encounter: Payer: Self-pay | Admitting: Neurology

## 2019-02-28 VITALS — BP 111/74 | HR 84 | Temp 97.7°F | Ht 68.25 in | Wt 200.5 lb

## 2019-02-28 DIAGNOSIS — R251 Tremor, unspecified: Secondary | ICD-10-CM | POA: Diagnosis not present

## 2019-02-28 NOTE — Progress Notes (Signed)
PATIENT: Robin Weaver DOB: 10-04-1975  Chief Complaint  Patient presents with   Tremors    Reports left hand tremors for the last two months.    Orthopaedics    Daryll Brod, MD - referring provider   PCP    Shanon Rosser, PA-C     HISTORICAL  Robin Weaver is a 44 year old female, seen in request by orthopedic surgeon Dr. Daryll Brod and primary care physician PA Long, [place] for evaluation of left hand tremor, initial evaluation on February 28, 2019.  I have reviewed and summarized the referring note from the referring physician. she had a history of migraine headache, taking Topamax, Imitrex as needed,  Before Christmas 2020, she noticed intermittent left hand tremor, while wrapping gift, half flexing her left fingers, she would have left finger tremor, but she has no difficulty performing her tasks, when she make a tight grip, she does not have tremor, symptoms has been intermittent since his onset, she denies persistent muscle weakness, she denies sensory loss,  In 2018, she had one episode of left arm deep achy pain, as if being squeezed tight by her blood pressure cuff, symptoms were intermittent, there is no limitation in her left arm and hand function, she denies significant neck pain, no right arm involvement, no left face, left leg involvement  REVIEW OF SYSTEMS: Full 14 system review of systems performed and notable only for  As above  All other review of systems were negative.  ALLERGIES: Allergies  Allergen Reactions   Vicodin [Hydrocodone-Acetaminophen] Nausea And Vomiting    HOME MEDICATIONS: Current Outpatient Medications  Medication Sig Dispense Refill   albuterol (PROVENTIL HFA;VENTOLIN HFA) 108 (90 BASE) MCG/ACT inhaler Inhale 2 puffs into the lungs every 6 (six) hours as needed. For asthma     ALPRAZolam (XANAX) 0.25 MG tablet Take 0.5 mg by mouth as needed for anxiety.      budesonide-formoterol (SYMBICORT) 160-4.5 MCG/ACT inhaler Inhale 2  puffs into the lungs 2 (two) times daily.     drospirenone-ethinyl estradiol (GIANVI) 3-0.02 MG tablet TAKE CONTINUOUSLY, SKIP THE LAST 4 DAYS OF PLACEBO 4 Package 3   DULoxetine (CYMBALTA) 60 MG capsule Take 60 mg by mouth daily.     meloxicam (MOBIC) 15 MG tablet Take 15 mg by mouth daily.     SUMAtriptan (IMITREX) 50 MG tablet Take 50 mg by mouth every 2 (two) hours as needed for migraine. May repeat in 2 hours if headache persists or recurs.     topiramate (TOPAMAX) 25 MG capsule Take 25 mg by mouth 2 (two) times daily. Take 3 tablets daily     No current facility-administered medications for this visit.    PAST MEDICAL HISTORY: Past Medical History:  Diagnosis Date   Anxiety    Breast pain, left    left diffuse breast pain x 10/2015   Cervical cancer ([place])    Treated with TLH/BS, pathology from specimen only with CIN III   Migraine without aura    TMJ disease    Tremor    left hand    PAST SURGICAL HISTORY: Past Surgical History:  Procedure Laterality Date   ROBOTIC ASSISTED TOTAL HYSTERECTOMY     With BS, had a ureteral injury, treated with a stent.    WRIST SURGERY      FAMILY HISTORY: Family History  Problem Relation Age of Onset   Hypertension Mother    Hypertension Father    Bladder Cancer Father    Colon cancer  Maternal Grandmother     SOCIAL HISTORY: Social History   Socioeconomic History   Marital status: Significant Other    Spouse name: Not on file   Number of children: 1   Years of education: some college   Highest education level: Not on file  Occupational History   Occupation: Client care specialist  Tobacco Use   Smoking status: Former Smoker    Packs/day: 0.50    Years: 12.00    Pack years: 6.00    Types: Cigarettes   Smokeless tobacco: Never Used  Substance and Sexual Activity   Alcohol use: Yes    Comment: occasional   Drug use: No   Sexual activity: Yes    Birth control/protection: Surgical    Comment:  Hysterectomy  Other Topics Concern   Not on file  Social History Narrative   Lives at home with significant other.   Right-handed.   Caffeine use: occasional   Social Determinants of Radio broadcast assistant Strain:    Difficulty of Paying Living Expenses: Not on file  Food Insecurity:    Worried About Charity fundraiser in the Last Year: Not on file   [employer] of Food in the Last Year: Not on file  Transportation Needs:    Lack of Transportation (Medical): Not on file   Lack of Transportation (Non-Medical): Not on file  Physical Activity:    Days of Exercise per Week: Not on file   Minutes of Exercise per Session: Not on file  Stress:    Feeling of Stress : Not on file  Social Connections:    Frequency of Communication with Friends and Family: Not on file   Frequency of Social Gatherings with Friends and Family: Not on file   Attends Religious Services: Not on file   Active Member of Clubs or Organizations: Not on file   Attends Archivist Meetings: Not on file   Marital Status: Not on file  Intimate Partner Violence:    Fear of Current or Ex-Partner: Not on file   Emotionally Abused: Not on file   Physically Abused: Not on file   Sexually Abused: Not on file     PHYSICAL EXAM   Vitals:   02/28/19 0750  BP: 111/74  Pulse: 84  Temp: 97.7 F (36.5 C)  Weight: 200 lb 8 oz (90.9 kg)  Height: 5' 8.25" (1.734 m)    Not recorded      Body mass index is 30.26 kg/m.  PHYSICAL EXAMNIATION:  Gen: NAD, conversant, well nourised, well groomed                     Cardiovascular: Regular rate rhythm, no peripheral edema, warm, nontender. Eyes: Conjunctivae clear without exudates or hemorrhage Neck: Supple, no carotid bruits. Pulmonary: Clear to auscultation bilaterally   NEUROLOGICAL EXAM:  MENTAL STATUS: Speech:    Speech is normal; fluent and spontaneous with normal comprehension.  Cognition:     Orientation to time, place and  person     Normal recent and remote memory     Normal Attention span and concentration     Normal Language, naming, repeating,spontaneous speech     Fund of knowledge   CRANIAL NERVES: CN II: Visual fields are full to confrontation. Pupils are round equal and briskly reactive to light. CN III, IV, VI: extraocular movement are normal. No ptosis. CN V: Facial sensation is intact to light touch CN VII: Face is symmetric with normal eye  closure  CN VIII: Hearing is normal to causal conversation. CN IX, X: Phonation is normal. CN XI: Head turning and shoulder shrug are intact  MOTOR: There is no pronator drift of out-stretched arms. Muscle bulk and tone are normal. Muscle strength is normal.  REFLEXES: Reflexes are 2+ and symmetric at the biceps, triceps, knees, and ankles. Plantar responses are flexor.  SENSORY: Intact to light touch, pinprick and vibratory sensation are intact in fingers and toes.  COORDINATION: There is no trunk or limb dysmetria noted.  GAIT/STANCE: Posture is normal. Gait is steady with normal steps, base, arm swing, and turning. Heel and toe walking are normal. Tandem gait is normal.  Romberg is absent.   DIAGNOSTIC DATA (LABS, IMAGING, TESTING) - I reviewed patient records, labs, notes, testing and imaging myself where available.   ASSESSMENT AND PLAN  Robin Weaver is a 44 y.o. female   Presented with intermittent left finger tremor when she half flexing her fingers,  Essentially normal neurological examination, no sensory loss, normal strength  Laboratory evaluation including thyroid function test  Continue to observe her symptoms, only return to clinic for worsening symptoms or new issues  Marcial Pacas, M.D. Ph.D.  [facility] [address] [place], [place] [address] Ph: [phone] Fax: [phone]  CC: Daryll Brod, MD, Shanon Rosser, [place]

## 2019-03-01 LAB — CBC WITH DIFFERENTIAL
Basophils Absolute: 0.1 10*3/uL (ref 0.0–0.2)
Basos: 1 %
EOS (ABSOLUTE): 0.3 10*3/uL (ref 0.0–0.4)
Eos: 4 %
Hematocrit: 42.4 % (ref 34.0–46.6)
Hemoglobin: 13.9 g/dL (ref 11.1–15.9)
Immature Grans (Abs): 0 10*3/uL (ref 0.0–0.1)
Immature Granulocytes: 0 %
Lymphocytes Absolute: 1.8 10*3/uL (ref 0.7–3.1)
Lymphs: 26 %
MCH: 26.8 pg (ref 26.6–33.0)
MCHC: 32.8 g/dL (ref 31.5–35.7)
MCV: 82 fL (ref 79–97)
Monocytes Absolute: 0.4 10*3/uL (ref 0.1–0.9)
Monocytes: 6 %
Neutrophils Absolute: 4.2 10*3/uL (ref 1.4–7.0)
Neutrophils: 63 %
RBC: 5.18 x10E6/uL (ref 3.77–5.28)
RDW: 13.6 % (ref 11.7–15.4)
WBC: 6.8 10*3/uL (ref 3.4–10.8)

## 2019-03-01 LAB — COMPREHENSIVE METABOLIC PANEL
ALT: 10 IU/L (ref 0–32)
AST: 9 IU/L (ref 0–40)
Albumin/Globulin Ratio: 1.5 (ref 1.2–2.2)
Albumin: 4.1 g/dL (ref 3.8–4.8)
Alkaline Phosphatase: 84 IU/L (ref 39–117)
BUN/Creatinine Ratio: 16 (ref 9–23)
BUN: 16 mg/dL (ref 6–24)
Bilirubin Total: <0.2 mg/dL (ref 0.0–1.2)
CO2: 18 mmol/L — ABNORMAL LOW (ref 20–29)
Calcium: 9.1 mg/dL (ref 8.7–10.2)
Chloride: 107 mmol/L — ABNORMAL HIGH (ref 96–106)
Creatinine, Ser: 0.98 mg/dL (ref 0.57–1.00)
GFR calc Af Amer: 82 mL/min/{1.73_m2} (ref >59)
GFR calc non Af Amer: 71 mL/min/{1.73_m2} (ref >59)
Globulin, Total: 2.7 g/dL (ref 1.5–4.5)
Glucose: 98 mg/dL (ref 65–99)
Potassium: 4.7 mmol/L (ref 3.5–5.2)
Sodium: 141 mmol/L (ref 134–144)
Total Protein: 6.8 g/dL (ref 6.0–8.5)

## 2019-03-01 LAB — TSH: TSH: 3.41 u[IU]/mL (ref 0.450–4.500)

## 2019-03-01 LAB — CK: Total CK: 35 U/L (ref 32–182)

## 2019-03-12 ENCOUNTER — Other Ambulatory Visit: Payer: Self-pay | Admitting: Physician Assistant

## 2019-03-12 DIAGNOSIS — Z1231 Encounter for screening mammogram for malignant neoplasm of breast: Secondary | ICD-10-CM

## 2019-03-20 ENCOUNTER — Other Ambulatory Visit: Payer: Self-pay

## 2019-03-20 ENCOUNTER — Ambulatory Visit
Admission: RE | Admit: 2019-03-20 | Discharge: 2019-03-20 | Disposition: A | Discharge status: Home / Self Care | Payer: BC Managed Care – PPO | Source: Ambulatory Visit | Attending: Physician Assistant | Admitting: Physician Assistant

## 2019-03-20 DIAGNOSIS — Z1231 Encounter for screening mammogram for malignant neoplasm of breast: Secondary | ICD-10-CM | POA: Diagnosis not present

## 2019-05-27 DIAGNOSIS — F411 Generalized anxiety disorder: Secondary | ICD-10-CM | POA: Diagnosis not present

## 2019-08-22 DIAGNOSIS — J4540 Moderate persistent asthma, uncomplicated: Secondary | ICD-10-CM | POA: Diagnosis not present

## 2019-10-16 DIAGNOSIS — R519 Headache, unspecified: Secondary | ICD-10-CM | POA: Diagnosis not present

## 2019-10-16 DIAGNOSIS — G43711 Chronic migraine without aura, intractable, with status migrainosus: Secondary | ICD-10-CM | POA: Diagnosis not present

## 2019-12-17 DIAGNOSIS — M546 Pain in thoracic spine: Secondary | ICD-10-CM | POA: Diagnosis not present

## 2019-12-17 DIAGNOSIS — J4540 Moderate persistent asthma, uncomplicated: Secondary | ICD-10-CM | POA: Diagnosis not present

## 2019-12-17 DIAGNOSIS — G43909 Migraine, unspecified, not intractable, without status migrainosus: Secondary | ICD-10-CM | POA: Diagnosis not present

## 2019-12-17 DIAGNOSIS — J019 Acute sinusitis, unspecified: Secondary | ICD-10-CM | POA: Diagnosis not present

## 2020-01-23 ENCOUNTER — Ambulatory Visit: Payer: BC Managed Care – PPO | Admitting: Obstetrics and Gynecology

## 2020-02-11 ENCOUNTER — Other Ambulatory Visit: Payer: Self-pay

## 2020-02-11 ENCOUNTER — Encounter: Payer: Self-pay | Admitting: Obstetrics and Gynecology

## 2020-02-11 ENCOUNTER — Ambulatory Visit: Payer: BC Managed Care – PPO | Admitting: Obstetrics and Gynecology

## 2020-02-11 ENCOUNTER — Other Ambulatory Visit (HOSPITAL_COMMUNITY)
Admission: RE | Admit: 2020-02-11 | Discharge: 2020-02-11 | Disposition: A | Discharge status: Home / Self Care | Payer: BC Managed Care – PPO | Source: Ambulatory Visit | Attending: Obstetrics and Gynecology | Admitting: Obstetrics and Gynecology

## 2020-02-11 VITALS — BP 126/70 | HR 88 | Ht 67.0 in | Wt 228.0 lb

## 2020-02-11 DIAGNOSIS — Z3041 Encounter for surveillance of contraceptive pills: Secondary | ICD-10-CM

## 2020-02-11 DIAGNOSIS — Z8541 Personal history of malignant neoplasm of cervix uteri: Secondary | ICD-10-CM | POA: Insufficient documentation

## 2020-02-11 DIAGNOSIS — Z1272 Encounter for screening for malignant neoplasm of vagina: Secondary | ICD-10-CM | POA: Insufficient documentation

## 2020-02-11 DIAGNOSIS — Z01419 Encounter for gynecological examination (general) (routine) without abnormal findings: Secondary | ICD-10-CM

## 2020-02-11 MED ORDER — DROSPIRENONE-ETHINYL ESTRADIOL 3-0.02 MG PO TABS: ORAL_TABLET | ORAL | 3 refills | Status: DC

## 2020-02-11 NOTE — Patient Instructions (Signed)

## 2020-02-11 NOTE — Progress Notes (Signed)
45 y.o. G1P1001 Significant Other White or Caucasian Not Hispanic or Latino female here for annual exam.  She had a hysterectomy for stage 1A! Grade 2 cervical cancer in 3/11.    She is on Yaz secondary to recurrent ovarian cysts and pelvic pain. Leta Baptist was chosen secondary to hirsutism (normal lab work). No pain. No worsening of hirsutism.  H/O low libido, improved with medication change. No dyspareunia.   No LMP recorded. Patient has had a hysterectomy.          Sexually active: Yes.    The current method of family planning is status post hysterectomy.    Exercising: Yes.    pilates, aerobics, and hiking Smoker:  no  Health Maintenance: Pap:   01/16/19 Neg  08/24/17 Neg:Neg HR HPV History of abnormal Pap:  yes MMG:  03/20/19 BIRADS 1 negative/density d BMD:   n/a Colonoscopy: n/a TDaP:  01/16/19  Gardasil: none   reports that she has quit smoking. Her smoking use included cigarettes. She has a 6.00 pack-year smoking history. She has never used smokeless tobacco. She reports current alcohol use. She reports that she does not use drugs. Rare ETOH. 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 Son is grown, Radiation protection practitioner.   Past Medical History:  Diagnosis Date  . Anxiety   . Breast pain, left    left diffuse breast pain x 10/2015  . Cervical cancer ([place])    Treated with TLH/BS, pathology from specimen only with CIN III  . Migraine without aura   . TMJ disease   . Tremor    left hand    Past Surgical History:  Procedure Laterality Date  . ROBOTIC ASSISTED TOTAL HYSTERECTOMY     With BS, had a ureteral injury, treated with a stent.   . WRIST SURGERY      Current Outpatient Medications  Medication Sig Dispense Refill  . albuterol (PROVENTIL HFA;VENTOLIN HFA) 108 (90 BASE) MCG/ACT inhaler Inhale 2 puffs into the lungs every 6 (six) hours as needed. For asthma    . ALPRAZolam (XANAX) 0.25 MG tablet Take 0.5 mg by mouth as needed for anxiety.     . budesonide-formoterol  (SYMBICORT) 160-4.5 MCG/ACT inhaler Inhale 2 puffs into the lungs 2 (two) times daily.    . drospirenone-ethinyl estradiol (GIANVI) 3-0.02 MG tablet TAKE CONTINUOUSLY, SKIP THE LAST 4 DAYS OF PLACEBO 4 Package 3  . DULoxetine (CYMBALTA) 60 MG capsule Take 60 mg by mouth daily.    . meloxicam (MOBIC) 15 MG tablet Take 15 mg by mouth daily.    . montelukast (SINGULAIR) 10 MG tablet Take 10 mg by mouth at bedtime.    . SUMAtriptan (IMITREX) 50 MG tablet Take 50 mg by mouth every 2 (two) hours as needed for migraine. May repeat in 2 hours if headache persists or recurs.    . topiramate (TOPAMAX) 25 MG capsule Take 25 mg by mouth 2 (two) times daily. Take 3 tablets daily     No current facility-administered medications for this visit.    Family History  Problem Relation Age of Onset  . Hypertension Mother   . Hypertension Father   . Bladder Cancer Father   . Colon cancer Maternal Grandmother     Review of Systems  Constitutional: Negative.   HENT: Negative.   Eyes: Negative.   Respiratory: Negative.   Cardiovascular: Negative.   Gastrointestinal: Negative.   Endocrine: Negative.   Genitourinary: Negative.   Musculoskeletal: Negative.   Skin: Negative.  Allergic/Immunologic: Negative.   Neurological: Negative.   Hematological: Negative.   Psychiatric/Behavioral: Negative.     Exam:   BP 126/70 (BP Location: Left Arm, Patient Position: Sitting, Cuff Size: Large)   Pulse 88   Ht 5\' 7"  (1.702 m)   Wt 228 lb (103.4 kg)   BMI 35.71 kg/m   Weight change: @WEIGHTCHANGE @ Height:   Height: 5\' 7"  (170.2 cm)  Ht Readings from Last 3 Encounters:  02/11/20 5\' 7"  (1.702 m)  02/28/19 5' 8.25" (1.734 m)  01/16/19 5' 8.25" (1.734 m)    General appearance: alert, cooperative and appears stated age Head: Normocephalic, without obvious abnormality, atraumatic Neck: no adenopathy, supple, symmetrical, trachea midline and thyroid normal to inspection and palpation Lungs: clear to  auscultation bilaterally Cardiovascular: regular rate and rhythm Breasts: normal appearance, no masses or tenderness Abdomen: soft, non-tender; non distended,  no masses,  no organomegaly Extremities: extremities normal, atraumatic, no cyanosis or edema Skin: Skin color, texture, turgor normal. No rashes or lesions Lymph nodes: Cervical, supraclavicular, and axillary nodes normal. No abnormal inguinal nodes palpated Neurologic: Grossly normal   Pelvic: External genitalia:  no lesions              Urethra:  normal appearing urethra with no masses, tenderness or lesions              Bartholins and Skenes: normal                 Vagina: normal appearing vagina with normal color and discharge, no lesions              Cervix: absent               Bimanual Exam:  Uterus:  uterus absent              Adnexa: no mass, fullness, tenderness               Rectovaginal: Confirms               Anus:  normal sphincter tone, no lesions  Terence Lux chaperoned for the exam.  1. Well woman exam Discussed breast self exam Discussed calcium and vit D intake Labs through work (she will send a copy)   2. History of cervical cancer  - Cytology - PAP  3. Screening for vaginal cancer  - Cytology - PAP  4. Encounter for surveillance of contraceptive pills On OCP's for h/o ovarian cysts, pelvic pain and hirsutism. Doing well - drospirenone-ethinyl estradiol (GIANVI) 3-0.02 MG tablet; TAKE CONTINUOUSLY, SKIP THE LAST 4 DAYS OF PLACEBO  Dispense: 112 tablet; Refill: 3

## 2020-02-13 ENCOUNTER — Other Ambulatory Visit: Payer: Self-pay | Admitting: Family Medicine

## 2020-02-13 ENCOUNTER — Other Ambulatory Visit: Payer: Self-pay | Admitting: *Deleted

## 2020-02-13 DIAGNOSIS — Z1231 Encounter for screening mammogram for malignant neoplasm of breast: Secondary | ICD-10-CM

## 2020-02-17 LAB — CYTOLOGY - PAP
Comment: NEGATIVE
Diagnosis: NEGATIVE
High risk HPV: NEGATIVE

## 2020-03-26 ENCOUNTER — Other Ambulatory Visit: Payer: Self-pay | Admitting: Family Medicine

## 2020-03-26 DIAGNOSIS — Z1231 Encounter for screening mammogram for malignant neoplasm of breast: Secondary | ICD-10-CM

## 2020-03-27 ENCOUNTER — Other Ambulatory Visit: Payer: Self-pay

## 2020-03-27 ENCOUNTER — Ambulatory Visit: Payer: BC Managed Care – PPO

## 2020-03-27 ENCOUNTER — Ambulatory Visit
Admission: RE | Admit: 2020-03-27 | Discharge: 2020-03-27 | Disposition: A | Discharge status: Home / Self Care | Payer: BC Managed Care – PPO | Source: Ambulatory Visit | Attending: Family Medicine | Admitting: Family Medicine

## 2020-03-27 DIAGNOSIS — Z1231 Encounter for screening mammogram for malignant neoplasm of breast: Secondary | ICD-10-CM

## 2020-04-24 DIAGNOSIS — F411 Generalized anxiety disorder: Secondary | ICD-10-CM | POA: Diagnosis not present

## 2020-04-24 DIAGNOSIS — F331 Major depressive disorder, recurrent, moderate: Secondary | ICD-10-CM | POA: Diagnosis not present

## 2020-04-24 DIAGNOSIS — G4700 Insomnia, unspecified: Secondary | ICD-10-CM | POA: Diagnosis not present

## 2020-04-24 DIAGNOSIS — J309 Allergic rhinitis, unspecified: Secondary | ICD-10-CM | POA: Diagnosis not present

## 2020-05-20 DIAGNOSIS — J0100 Acute maxillary sinusitis, unspecified: Secondary | ICD-10-CM | POA: Diagnosis not present

## 2020-05-20 DIAGNOSIS — N760 Acute vaginitis: Secondary | ICD-10-CM | POA: Diagnosis not present

## 2020-11-27 DIAGNOSIS — F411 Generalized anxiety disorder: Secondary | ICD-10-CM | POA: Diagnosis not present

## 2020-11-27 DIAGNOSIS — G4700 Insomnia, unspecified: Secondary | ICD-10-CM | POA: Diagnosis not present

## 2020-11-27 DIAGNOSIS — F331 Major depressive disorder, recurrent, moderate: Secondary | ICD-10-CM | POA: Diagnosis not present

## 2021-01-12 DIAGNOSIS — F331 Major depressive disorder, recurrent, moderate: Secondary | ICD-10-CM | POA: Diagnosis not present

## 2021-01-12 DIAGNOSIS — F411 Generalized anxiety disorder: Secondary | ICD-10-CM | POA: Diagnosis not present

## 2021-01-12 DIAGNOSIS — G4700 Insomnia, unspecified: Secondary | ICD-10-CM | POA: Diagnosis not present

## 2021-02-15 ENCOUNTER — Other Ambulatory Visit: Payer: Self-pay | Admitting: Family Medicine

## 2021-02-15 ENCOUNTER — Other Ambulatory Visit: Payer: Self-pay | Admitting: Obstetrics and Gynecology

## 2021-02-15 ENCOUNTER — Other Ambulatory Visit: Payer: Self-pay | Admitting: Physician Assistant

## 2021-02-15 DIAGNOSIS — Z1231 Encounter for screening mammogram for malignant neoplasm of breast: Secondary | ICD-10-CM

## 2021-02-15 NOTE — Progress Notes (Signed)
46 y.o. G1P1001 Significant Other White or Caucasian Not Hispanic or Latino female here for annual exam.  Lives with her fiance (together x 13 years). She had a hysterectomy for stage 1A1 Grade 2 cervical cancer in 3/11.  She is on Yaz secondary to recurrent ovarian cysts and pelvic pain. Leta Baptist was chosen secondary to hirsutism (normal lab work). No pain. No worsening of hirsutism.  Her Dad is going on hospice today at her home. He is only 73, has bladder.     No LMP recorded. Patient has had a hysterectomy.          Sexually active: Yes.    The current method of family planning is status post hysterectomy.    Exercising: Yes.     Walking  Smoker:  no  Health Maintenance: Pap:  02/11/20 WNL Hr HPV Neg   01/16/19 Neg             08/24/17 Neg:Neg HR HPV History of abnormal Pap:  yes MMG:  03/27/20 density C Bi-rads 1 neg, has an appointment.   BMD:   n/a Colonoscopy: at 39, f/u at 89 (Mom with polyps, MGM with colon cancer) TDaP:  01/06/19 Gardasil: none    reports that she has quit smoking. Her smoking use included cigarettes. She has a 6.00 pack-year smoking history. She has never used smokeless tobacco. She reports current alcohol use. She reports that she does not use drugs. Rare ETOH. 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   Past Medical History:  Diagnosis Date   Anxiety    Breast pain, left    left diffuse breast pain x 10/2015   Cervical cancer ([place])    Treated with TLH/BS, pathology from specimen only with CIN III   Migraine without aura    TMJ disease    Tremor    left hand    Past Surgical History:  Procedure Laterality Date   ROBOTIC ASSISTED TOTAL HYSTERECTOMY     With BS, had a ureteral injury, treated with a stent.    WRIST SURGERY      Current Outpatient Medications  Medication Sig Dispense Refill   albuterol (PROVENTIL HFA;VENTOLIN HFA) 108 (90 BASE) MCG/ACT inhaler Inhale 2 puffs into the lungs every 6 (six) hours as  needed. For asthma     ALPRAZolam (XANAX) 0.25 MG tablet Take 0.5 mg by mouth as needed for anxiety.      budesonide-formoterol (SYMBICORT) 160-4.5 MCG/ACT inhaler Inhale 2 puffs into the lungs 2 (two) times daily.     drospirenone-ethinyl estradiol (GIANVI) 3-0.02 MG tablet TAKE CONTINUOUSLY, SKIP THE LAST 4 DAYS OF PLACEBO 112 tablet 3   meloxicam (MOBIC) 15 MG tablet Take 15 mg by mouth daily.     montelukast (SINGULAIR) 10 MG tablet Take 10 mg by mouth at bedtime.     topiramate (TOPAMAX) 25 MG capsule Take 25 mg by mouth 2 (two) times daily. Take 3 tablets daily     escitalopram (LEXAPRO) 10 MG tablet Take 10 mg by mouth daily.     oxybutynin (DITROPAN-XL) 5 MG 24 hr tablet Take 5 mg by mouth daily.     rizatriptan (MAXALT-MLT) 5 MG disintegrating tablet Take by mouth.     No current facility-administered medications for this visit.    Family History  Problem Relation Age of Onset   Hypertension Mother    Hypertension Father    Bladder Cancer Father    Colon cancer Maternal Grandmother  Review of Systems  All other systems reviewed and are negative.  Exam:   BP 130/72    Pulse 97    Ht 5\' 9"  (1.753 m)    Wt 228 lb (103.4 kg)    SpO2 99%    BMI 33.67 kg/m   Weight change: @WEIGHTCHANGE @ Height:   Height: 5\' 9"  (175.3 cm)  Ht Readings from Last 3 Encounters:  03/01/21 5\' 9"  (1.753 m)  02/11/20 5\' 7"  (1.702 m)  02/28/19 5' 8.25" (1.734 m)    General appearance: alert, cooperative and appears stated age Head: Normocephalic, without obvious abnormality, atraumatic Neck: no adenopathy, supple, symmetrical, trachea midline and thyroid normal to inspection and palpation Lungs: clear to auscultation bilaterally Cardiovascular: regular rate and rhythm Breasts: normal appearance, no masses or tenderness Abdomen: soft, non-tender; non distended,  no masses,  no organomegaly Extremities: extremities normal, atraumatic, no cyanosis or edema Skin: Skin color, texture, turgor  normal. No rashes or lesions Lymph nodes: Cervical, supraclavicular, and axillary nodes normal. No abnormal inguinal nodes palpated Neurologic: Grossly normal   Pelvic: External genitalia:  no lesions              Urethra:  normal appearing urethra with no masses, tenderness or lesions              Bartholins and Skenes: normal                 Vagina: normal appearing vagina with normal color and discharge, no lesions              Cervix: absent               Bimanual Exam:  Uterus:  uterus absent              Adnexa: no mass, fullness, tenderness               Rectovaginal: Confirms               Anus:  normal sphincter tone, no lesions  Gae Dry chaperoned for the exam.  1. Well woman exam Discussed breast self exam Discussed calcium and vit D intake Mammogram next month Labs with primary Colonoscopy next year (mom has polyps, she had a colonoscopy at 19)  2. History of cervical cancer - Cytology - PAP  3. Screening for vaginal cancer - Cytology - PAP  4. Encounter for surveillance of contraceptive pills Doing well - drospirenone-ethinyl estradiol (GIANVI) 3-0.02 MG tablet; TAKE CONTINUOUSLY, SKIP THE LAST 4 DAYS OF PLACEBO  Dispense: 112 tablet; Refill: 3

## 2021-03-01 ENCOUNTER — Encounter: Payer: Self-pay | Admitting: Obstetrics and Gynecology

## 2021-03-01 ENCOUNTER — Ambulatory Visit (INDEPENDENT_AMBULATORY_CARE_PROVIDER_SITE_OTHER): Payer: BC Managed Care – PPO | Admitting: Obstetrics and Gynecology

## 2021-03-01 ENCOUNTER — Other Ambulatory Visit (HOSPITAL_COMMUNITY)
Admission: RE | Admit: 2021-03-01 | Discharge: 2021-03-01 | Disposition: A | Discharge status: Home / Self Care | Payer: BC Managed Care – PPO | Source: Ambulatory Visit | Attending: Obstetrics and Gynecology | Admitting: Obstetrics and Gynecology

## 2021-03-01 ENCOUNTER — Other Ambulatory Visit: Payer: Self-pay

## 2021-03-01 VITALS — BP 130/72 | HR 97 | Ht 69.0 in | Wt 228.0 lb

## 2021-03-01 DIAGNOSIS — Z8541 Personal history of malignant neoplasm of cervix uteri: Secondary | ICD-10-CM | POA: Insufficient documentation

## 2021-03-01 DIAGNOSIS — Z1272 Encounter for screening for malignant neoplasm of vagina: Secondary | ICD-10-CM | POA: Insufficient documentation

## 2021-03-01 DIAGNOSIS — Z01419 Encounter for gynecological examination (general) (routine) without abnormal findings: Secondary | ICD-10-CM | POA: Diagnosis not present

## 2021-03-01 DIAGNOSIS — Z3041 Encounter for surveillance of contraceptive pills: Secondary | ICD-10-CM | POA: Diagnosis not present

## 2021-03-01 MED ORDER — DROSPIRENONE-ETHINYL ESTRADIOL 3-0.02 MG PO TABS: ORAL_TABLET | ORAL | 3 refills | Status: DC

## 2021-03-01 NOTE — Patient Instructions (Signed)

## 2021-03-02 LAB — CYTOLOGY - PAP
Comment: NEGATIVE
Diagnosis: NEGATIVE
High risk HPV: NEGATIVE

## 2021-03-10 DIAGNOSIS — R051 Acute cough: Secondary | ICD-10-CM | POA: Diagnosis not present

## 2021-03-29 DIAGNOSIS — Z1231 Encounter for screening mammogram for malignant neoplasm of breast: Secondary | ICD-10-CM

## 2021-03-31 DIAGNOSIS — K148 Other diseases of tongue: Secondary | ICD-10-CM | POA: Diagnosis not present

## 2021-04-06 ENCOUNTER — Ambulatory Visit
Admission: RE | Admit: 2021-04-06 | Discharge: 2021-04-06 | Disposition: A | Discharge status: Home / Self Care | Payer: BC Managed Care – PPO | Source: Ambulatory Visit | Attending: Physician Assistant | Admitting: Physician Assistant

## 2021-04-06 DIAGNOSIS — Z1231 Encounter for screening mammogram for malignant neoplasm of breast: Secondary | ICD-10-CM

## 2021-04-07 ENCOUNTER — Other Ambulatory Visit: Payer: Self-pay | Admitting: Physician Assistant

## 2021-04-07 DIAGNOSIS — R928 Other abnormal and inconclusive findings on diagnostic imaging of breast: Secondary | ICD-10-CM

## 2021-04-23 ENCOUNTER — Other Ambulatory Visit: Payer: BC Managed Care – PPO

## 2021-04-28 ENCOUNTER — Other Ambulatory Visit: Payer: Self-pay | Admitting: Family Medicine

## 2021-04-29 ENCOUNTER — Other Ambulatory Visit: Payer: Self-pay | Admitting: Obstetrics and Gynecology

## 2021-04-29 DIAGNOSIS — R928 Other abnormal and inconclusive findings on diagnostic imaging of breast: Secondary | ICD-10-CM

## 2021-04-30 ENCOUNTER — Ambulatory Visit
Admission: RE | Admit: 2021-04-30 | Discharge: 2021-04-30 | Disposition: A | Discharge status: Home / Self Care | Payer: BC Managed Care – PPO | Source: Ambulatory Visit | Attending: Physician Assistant | Admitting: Physician Assistant

## 2021-04-30 DIAGNOSIS — R928 Other abnormal and inconclusive findings on diagnostic imaging of breast: Secondary | ICD-10-CM

## 2021-04-30 DIAGNOSIS — R922 Inconclusive mammogram: Secondary | ICD-10-CM | POA: Diagnosis not present

## 2021-04-30 DIAGNOSIS — N6489 Other specified disorders of breast: Secondary | ICD-10-CM | POA: Diagnosis not present

## 2021-05-08 ENCOUNTER — Encounter (HOSPITAL_BASED_OUTPATIENT_CLINIC_OR_DEPARTMENT_OTHER): Payer: Self-pay | Admitting: Emergency Medicine

## 2021-05-08 ENCOUNTER — Emergency Department (HOSPITAL_BASED_OUTPATIENT_CLINIC_OR_DEPARTMENT_OTHER)
Admission: EM | Admit: 2021-05-08 | Discharge: 2021-05-09 | Disposition: A | Discharge status: Home / Self Care | Payer: BC Managed Care – PPO | Attending: Emergency Medicine | Admitting: Emergency Medicine

## 2021-05-08 ENCOUNTER — Other Ambulatory Visit: Payer: Self-pay

## 2021-05-08 DIAGNOSIS — L509 Urticaria, unspecified: Secondary | ICD-10-CM | POA: Diagnosis not present

## 2021-05-08 DIAGNOSIS — R21 Rash and other nonspecific skin eruption: Secondary | ICD-10-CM | POA: Diagnosis not present

## 2021-05-08 DIAGNOSIS — L239 Allergic contact dermatitis, unspecified cause: Secondary | ICD-10-CM | POA: Diagnosis not present

## 2021-05-08 MED ORDER — HYDROXYZINE HCL 25 MG PO TABS
25.0000 mg | ORAL_TABLET | Freq: Once | ORAL | Status: AC
  Administered 2021-05-08: 25 mg via ORAL
  Filled 2021-05-08: qty 1

## 2021-05-08 MED ORDER — EPINEPHRINE 0.3 MG/0.3ML IJ SOAJ: 0.3000 mg | INTRAMUSCULAR | 0 refills | Status: AC | PRN

## 2021-05-08 NOTE — ED Triage Notes (Signed)
Pt is having an allergic reaction, she thinks to something drank last night - seen by PMD this morning and given rx for prednisone. Pt also took 1 zyrtec - both at approx 1pm.   Pt reports hives continue to worsen and rash is very itchy. ?

## 2021-05-08 NOTE — ED Provider Notes (Signed)
?[facility] ?Provider Note ? ? ?CSN: [id number] ?Arrival date & time: 05/08/21  1943 ? ?  ? ?History ? ?Chief Complaint  ?Patient presents with  ? Rash  ? ? ?Robin Weaver is a 46 y.o. female. ? ?The history is provided by the patient.  ?Rash ?Robin Weaver is a 46 y.o. female who presents to the Emergency Department complaining of rash.  She presents to the ED for evaluation of hives that started last night around 9pm.  They have been progressively worsening throughout the day.  She saw her PCP this morning and prescribed prednisone and zyrtec.  Sxs are worsening despite these interventions.  Had a new drink last night - celestial seasoning sleepy time tea with ecchinacia.   ? ?No new detergents, creams, soaps.   ? ?No throat swelling, sob.  N/V, near syncope.   ? ?No chance of pregnancy, status post hysterectomy.  No history of liver disease. ?  ? ?Home Medications ?Prior to Admission medications   ?Medication Sig Start Date End Date Taking? Authorizing Provider  ?EPINEPHrine 0.3 mg/0.3 mL IJ SOAJ injection Inject 0.3 mg into the muscle as needed for anaphylaxis. 05/08/21  Yes Quintella Reichert, MD  ?albuterol (PROVENTIL HFA;VENTOLIN HFA) 108 (90 BASE) MCG/ACT inhaler Inhale 2 puffs into the lungs every 6 (six) hours as needed. For asthma    [provider]  ?ALPRAZolam (XANAX) 0.25 MG tablet Take 0.5 mg by mouth as needed for anxiety.  05/11/09   [provider]  ?budesonide-formoterol (SYMBICORT) 160-4.5 MCG/ACT inhaler Inhale 2 puffs into the lungs 2 (two) times daily.    [provider]  ?drospirenone-ethinyl estradiol Quincy Carnes) 3-0.02 MG tablet TAKE CONTINUOUSLY, SKIP THE LAST 4 DAYS OF PLACEBO 03/01/21   Salvadore Dom, MD  ?escitalopram (LEXAPRO) 10 MG tablet Take 10 mg by mouth daily. 10/27/20   [provider]  ?meloxicam (MOBIC) 15 MG tablet Take 15 mg by mouth daily.    [provider]  ?montelukast (SINGULAIR) 10 MG tablet  Take 10 mg by mouth at bedtime.    [provider]  ?oxybutynin (DITROPAN-XL) 5 MG 24 hr tablet Take 5 mg by mouth daily. 01/19/21   [provider]  ?rizatriptan (MAXALT-MLT) 5 MG disintegrating tablet Take by mouth. 10/27/20   [provider]  ?topiramate (TOPAMAX) 25 MG capsule Take 25 mg by mouth 2 (two) times daily. Take 3 tablets daily    [provider]  ?   ? ?Allergies    ?Vicodin [hydrocodone-acetaminophen]   ? ?Review of Systems   ?Review of Systems  ?Skin:  Positive for rash.  ?All other systems reviewed and are negative. ? ?Physical Exam ?Updated Vital Signs ?BP 129/71 (BP Location: Right Arm)   Pulse 65   Temp 98.2 ?F (36.8 ?C) (Temporal)   Resp 18   SpO2 100%  ?Physical Exam ?Vitals and nursing note reviewed.  ?Constitutional:   ?   Appearance: She is well-developed.  ?HENT:  ?   Head: Normocephalic and atraumatic.  ?   Mouth/Throat:  ?   Mouth: Mucous membranes are moist.  ?   Pharynx: No oropharyngeal exudate or posterior oropharyngeal erythema.  ?Cardiovascular:  ?   Rate and Rhythm: Normal rate and regular rhythm.  ?   Heart sounds: No murmur heard. ?Pulmonary:  ?   Effort: Pulmonary effort is normal. No respiratory distress.  ?   Breath sounds: Normal breath sounds.  ?Abdominal:  ?   Palpations: Abdomen is soft.  ?  Tenderness: There is no abdominal tenderness. There is no guarding or rebound.  ?Musculoskeletal:     ?   General: No tenderness.  ?Skin: ?   General: Skin is warm and dry.  ?   Findings: Rash present.  ?   Comments: Diffuse urticaria  ?Neurological:  ?   Mental Status: She is alert and oriented to person, place, and time.  ?Psychiatric:     ?   Behavior: Behavior normal.  ? ? ?ED Results / Procedures / Treatments   ?Labs ?(all labs ordered are listed, but only abnormal results are displayed) ?Labs Reviewed - No data to display ? ?EKG ?None ? ?Radiology ?No results found. ? ?Procedures ?Procedures  ? ? ?Medications Ordered in ED ?Medications   ?hydrOXYzine (ATARAX) tablet 25 mg (25 mg Oral Given 05/08/21 2322)  ?diphenhydrAMINE (BENADRYL) capsule 50 mg (50 mg Oral Given 05/09/21 0042)  ? ? ?ED Course/ Medical Decision Making/ A&P ?  ?                        ?Medical Decision Making ?Risk ?Prescription drug management. ? ? ?Patient here for evaluation of urticaria that started last night, progressing despite taking prednisone, Zyrtec at home.  She does get very drowsy with Benadryl and prefers to avoid this route.  She was treated with hydroxyzine in the emergency department and observed.  She did not have any improvement in her rash and in fact this appeared to be slightly worse on reevaluation.  No evidence of anaphylaxis.  Recommend treating with Benadryl every 4 hours for the next several days and continuing her steroids and Zyrtec.  Will prescribe EpiPen in the event her symptoms worsen.  Discussed home care for urticaria as well as close return precautions. ? ? ? ? ? ? ? ?Final Clinical Impression(s) / ED Diagnoses ?Final diagnoses:  ?Urticaria  ? ? ?Rx / DC Orders ?ED Discharge Orders   ? ?      Ordered  ?  EPINEPHrine 0.3 mg/0.3 mL IJ SOAJ injection  As needed       ? 05/08/21 2313  ? ?  ?  ? ?  ? ? ?  ?Quintella Reichert, MD ?05/09/21 0117 ? ?

## 2021-05-09 MED ORDER — DIPHENHYDRAMINE HCL 25 MG PO CAPS
50.0000 mg | ORAL_CAPSULE | Freq: Once | ORAL | Status: AC
  Administered 2021-05-09: 50 mg via ORAL
  Filled 2021-05-09: qty 2

## 2021-05-10 DIAGNOSIS — R21 Rash and other nonspecific skin eruption: Secondary | ICD-10-CM | POA: Diagnosis not present

## 2021-07-19 DIAGNOSIS — G4700 Insomnia, unspecified: Secondary | ICD-10-CM | POA: Diagnosis not present

## 2021-07-19 DIAGNOSIS — G43909 Migraine, unspecified, not intractable, without status migrainosus: Secondary | ICD-10-CM | POA: Diagnosis not present

## 2021-07-19 DIAGNOSIS — F411 Generalized anxiety disorder: Secondary | ICD-10-CM | POA: Diagnosis not present

## 2021-07-19 DIAGNOSIS — F331 Major depressive disorder, recurrent, moderate: Secondary | ICD-10-CM | POA: Diagnosis not present

## 2021-07-21 DIAGNOSIS — Z0000 Encounter for general adult medical examination without abnormal findings: Secondary | ICD-10-CM | POA: Diagnosis not present

## 2021-07-27 DIAGNOSIS — Z1211 Encounter for screening for malignant neoplasm of colon: Secondary | ICD-10-CM | POA: Diagnosis not present

## 2021-07-27 DIAGNOSIS — Z23 Encounter for immunization: Secondary | ICD-10-CM | POA: Diagnosis not present

## 2021-07-27 DIAGNOSIS — Z0000 Encounter for general adult medical examination without abnormal findings: Secondary | ICD-10-CM | POA: Diagnosis not present

## 2021-07-28 DIAGNOSIS — J4540 Moderate persistent asthma, uncomplicated: Secondary | ICD-10-CM | POA: Diagnosis not present

## 2021-07-28 DIAGNOSIS — D649 Anemia, unspecified: Secondary | ICD-10-CM | POA: Diagnosis not present

## 2021-07-28 DIAGNOSIS — E781 Pure hyperglyceridemia: Secondary | ICD-10-CM | POA: Diagnosis not present

## 2021-08-25 DIAGNOSIS — E611 Iron deficiency: Secondary | ICD-10-CM | POA: Diagnosis not present

## 2021-10-20 DIAGNOSIS — E781 Pure hyperglyceridemia: Secondary | ICD-10-CM | POA: Diagnosis not present

## 2021-10-20 DIAGNOSIS — E282 Polycystic ovarian syndrome: Secondary | ICD-10-CM | POA: Diagnosis not present

## 2021-10-20 DIAGNOSIS — E611 Iron deficiency: Secondary | ICD-10-CM | POA: Diagnosis not present

## 2021-10-22 DIAGNOSIS — D649 Anemia, unspecified: Secondary | ICD-10-CM | POA: Diagnosis not present

## 2021-10-22 DIAGNOSIS — E559 Vitamin D deficiency, unspecified: Secondary | ICD-10-CM | POA: Diagnosis not present

## 2021-10-22 DIAGNOSIS — E782 Mixed hyperlipidemia: Secondary | ICD-10-CM | POA: Diagnosis not present

## 2021-11-22 DIAGNOSIS — I6529 Occlusion and stenosis of unspecified carotid artery: Secondary | ICD-10-CM | POA: Diagnosis not present

## 2021-11-22 DIAGNOSIS — R519 Headache, unspecified: Secondary | ICD-10-CM | POA: Diagnosis not present

## 2021-11-22 DIAGNOSIS — R7982 Elevated C-reactive protein (CRP): Secondary | ICD-10-CM | POA: Diagnosis not present

## 2021-11-23 ENCOUNTER — Other Ambulatory Visit: Payer: Self-pay | Admitting: Nurse Practitioner

## 2021-11-23 DIAGNOSIS — R0989 Other specified symptoms and signs involving the circulatory and respiratory systems: Secondary | ICD-10-CM

## 2021-11-23 DIAGNOSIS — G4489 Other headache syndrome: Secondary | ICD-10-CM

## 2021-11-23 DIAGNOSIS — I6521 Occlusion and stenosis of right carotid artery: Secondary | ICD-10-CM

## 2021-11-23 DIAGNOSIS — R7982 Elevated C-reactive protein (CRP): Secondary | ICD-10-CM

## 2021-11-23 DIAGNOSIS — G44221 Chronic tension-type headache, intractable: Secondary | ICD-10-CM

## 2021-11-25 ENCOUNTER — Ambulatory Visit
Admission: RE | Admit: 2021-11-25 | Discharge: 2021-11-25 | Disposition: A | Discharge status: Home / Self Care | Payer: BC Managed Care – PPO | Source: Ambulatory Visit | Attending: Nurse Practitioner | Admitting: Nurse Practitioner

## 2021-11-25 DIAGNOSIS — G44221 Chronic tension-type headache, intractable: Secondary | ICD-10-CM

## 2021-11-25 DIAGNOSIS — G4489 Other headache syndrome: Secondary | ICD-10-CM

## 2021-11-25 DIAGNOSIS — I6521 Occlusion and stenosis of right carotid artery: Secondary | ICD-10-CM

## 2021-11-25 DIAGNOSIS — R0989 Other specified symptoms and signs involving the circulatory and respiratory systems: Secondary | ICD-10-CM | POA: Diagnosis not present

## 2021-11-25 DIAGNOSIS — R7982 Elevated C-reactive protein (CRP): Secondary | ICD-10-CM

## 2021-12-02 DIAGNOSIS — R519 Headache, unspecified: Secondary | ICD-10-CM | POA: Diagnosis not present

## 2021-12-02 DIAGNOSIS — G43909 Migraine, unspecified, not intractable, without status migrainosus: Secondary | ICD-10-CM | POA: Insufficient documentation

## 2021-12-06 ENCOUNTER — Other Ambulatory Visit: Payer: Self-pay | Admitting: Nurse Practitioner

## 2021-12-06 DIAGNOSIS — R0989 Other specified symptoms and signs involving the circulatory and respiratory systems: Secondary | ICD-10-CM

## 2021-12-06 DIAGNOSIS — I6521 Occlusion and stenosis of right carotid artery: Secondary | ICD-10-CM

## 2021-12-06 DIAGNOSIS — R7982 Elevated C-reactive protein (CRP): Secondary | ICD-10-CM

## 2021-12-06 DIAGNOSIS — G44221 Chronic tension-type headache, intractable: Secondary | ICD-10-CM

## 2021-12-15 ENCOUNTER — Other Ambulatory Visit (HOSPITAL_COMMUNITY): Payer: Self-pay | Admitting: Nurse Practitioner

## 2021-12-15 DIAGNOSIS — R0989 Other specified symptoms and signs involving the circulatory and respiratory systems: Secondary | ICD-10-CM

## 2021-12-15 DIAGNOSIS — G44221 Chronic tension-type headache, intractable: Secondary | ICD-10-CM

## 2021-12-15 DIAGNOSIS — R7982 Elevated C-reactive protein (CRP): Secondary | ICD-10-CM

## 2021-12-15 DIAGNOSIS — I6521 Occlusion and stenosis of right carotid artery: Secondary | ICD-10-CM

## 2022-01-04 ENCOUNTER — Encounter (HOSPITAL_COMMUNITY): Payer: Self-pay | Admitting: Otolaryngology

## 2022-01-04 ENCOUNTER — Other Ambulatory Visit: Payer: Self-pay

## 2022-01-04 NOTE — H&P (Signed)
HPI:  Robin Weaver is a 46 y.o. female who presents as a consult Patient.  Referring Provider: Forestine Na, MD  Chief complaint: Possible giant cell arteritis.  HPI: Lifelong history of intense migraines. For the past month she has been experiencing these chronic headaches that seem to be centered around the right parietal scalp area. She had an elevated C-reactive protein level. I do not have any of her other lab work. She denies any visual changes. She had carotid duplex studies recently which were clear.  PMH/Meds/All/SocHx/FamHx/ROS:  Past Medical History: Diagnosis Date  Anxiety  Arthritis  Asthma  Cancer ([place]) Cervical  Past Surgical History: Procedure Laterality Date  CYST REMOVAL Right 2001 Wrist  HYSTERECTOMY  No family history of bleeding disorders, wound healing problems or difficulty with anesthesia.  Social History  Socioeconomic History  Marital status: Single Spouse name: Not on file  Number of children: Not on file  Years of education: Not on file  Highest education level: Not on file Occupational History  Not on file Tobacco Use  Smoking status: Former  Smokeless tobacco: Never Substance and Sexual Activity  Alcohol use: Yes Comment: Social  Drug use: Never  Sexual activity: Not on file Other Topics Concern  Not on file Social History Narrative  Not on file  Social Determinants of Health  Financial Resource Strain: Not on file Food Insecurity: Not on file Transportation Needs: Not on file Physical Activity: Not on file Stress: Not on file Social Connections: Not on file Housing Stability: Not on file  Current Outpatient Medications:  albuterol (PROAIR HFA) 90 mcg/actuation inhaler, Inhale 2 puffs every 4 hours by inhalation route as needed for 30 days., Disp: , Rfl:  ALPRAZolam (XANAX) 0.25 MG tablet, , Disp: , Rfl:  budesonide-formoteroL (SYMBICORT) 80-4.5 mcg/actuation inhaler, Inhale 2 puffs into the lungs 2 times daily.,  Disp: , Rfl:  drospirenone-ethinyl estradioL (YAZ) 3-0.02 mg per tablet, TAKE CONTINUOUSLY, SKIP THE LAST 4 DAYS OF PLACEBO, Disp: , Rfl:  EPINEPHrine (EPIPEN) 0.3 mg/0.3 mL auto-injector, Inject 0.3 mLs (0.3 mg total) into the muscle., Disp: , Rfl:  escitalopram oxalate (LEXAPRO) 20 MG tablet, Take 1 tablet (20 mg total) by mouth daily., Disp: , Rfl:  ferrous sulfate (FERATAB) 325 (65 FE) MG tablet, Take 1 tablet (325 mg total) by mouth 2 times daily., Disp: , Rfl:  meclizine (ANTIVERT) 25 MG chewable tablet, Chew 1 tablet (25 mg total) by mouth 3 (three) times daily as needed., Disp: , Rfl:  meloxicam (MOBIC) 15 MG tablet, Take 1 tablet (15 mg total) by mouth daily., Disp: , Rfl:  montelukast (SINGULAIR) 10 mg tablet, Take 1 tablet (10 mg total) by mouth daily., Disp: , Rfl:  omeprazole (PRILOSEC) 20 MG DR capsule, TAKE 1 CAPSULE BY MOUTH DAILY 30 MINUTES BEFORE MORNING MEAL., Disp: , Rfl:  oxybutynin XL (DITROPAN XL) 5 MG extended release tablet, Take 1 tablet (5 mg total) by mouth daily., Disp: , Rfl:  rizatriptan (MAXALT) 10 MG tablet, Take 1 tablet (10 mg total) by mouth as needed for Migraine. May repeat in 2 hours if needed, Disp: , Rfl:  SAXENDA 3 mg/0.5 mL (18 mg/3 mL) injection, INJECT '3MG'$  SUBCUTANEOUSLY ONCE DAILY, Disp: , Rfl:  topiramate (TOPAMAX) 25 MG sprinkle capsule, Take 1 capsule (25 mg total) by mouth 2 times daily., Disp: , Rfl:  fexofenadine (ALLEGRA) 180 MG tablet, Take 1 tablet (180 mg total) by mouth daily., Disp: , Rfl:  fluconazole (DIFLUCAN) 150 MG tablet, , Disp: , Rfl:  loratadine (  CLARITIN) 10 mg tablet, , Disp: , Rfl:  A complete ROS was performed with pertinent positives/negatives noted in the HPI. The remainder of the ROS are negative.   Physical Exam:  Temp 97.1 F (36.2 C)  Ht 1.753 m ('5\' 9"'$ )  Wt 98.4 kg (217 lb)  BMI 32.05 kg/m  General: Healthy and alert, in no distress, breathing easily. Normal affect. In a pleasant mood. Head: Normocephalic,  atraumatic. No masses, or scars. Eyes: Pupils are equal, and reactive to light. Vision is grossly intact. No spontaneous or gaze nystagmus. Ears: Ear canals are clear. Tympanic membranes are intact, with normal landmarks and the middle ears are clear and healthy. Hearing: Grossly normal. Nose: Nasal cavities are clear with healthy mucosa, no polyps or exudate. Airways are patent. Face: No masses or scars, facial nerve function is symmetric. Oral Cavity: No mucosal abnormalities are noted. Tongue with normal mobility. Dentition appears healthy. Oropharynx: Tonsils are symmetric. There are no mucosal masses identified. Tongue base appears normal and healthy. Larynx/Hypopharynx: deferred Chest: Deferred Neck: No palpable masses, no cervical adenopathy, no thyroid nodules or enlargement. Neuro: Cranial nerves II-XII with normal function. Balance: Normal gate. Other findings: none.  Independent Review of Additional Tests or Records: none  Procedures: none  Impression & Plans: Chronic headaches, concerned about possible giant cell arteritis. I would like to review the blood work she has had. We will try to obtain these copies. If appropriate then I will recommend that we do outpatient temporal artery biopsy on the right side.

## 2022-01-04 NOTE — Progress Notes (Signed)
Spoke with pt for pre-op call. Pt denies cardiac history, HTN or Diabetes.  Pt has hx of migraines  Shower instructions given to pt and she voiced understanding.

## 2022-01-05 ENCOUNTER — Ambulatory Visit (HOSPITAL_COMMUNITY)
Admission: RE | Admit: 2022-01-05 | Discharge: 2022-01-05 | Disposition: A | Discharge status: Home / Self Care | Payer: BC Managed Care – PPO | Attending: Otolaryngology | Admitting: Otolaryngology

## 2022-01-05 ENCOUNTER — Ambulatory Visit (HOSPITAL_COMMUNITY): Payer: BC Managed Care – PPO | Admitting: Anesthesiology

## 2022-01-05 ENCOUNTER — Encounter (HOSPITAL_COMMUNITY)
Admission: RE | Disposition: A | Discharge status: Home / Self Care | Payer: Self-pay | Source: Home / Self Care | Attending: Otolaryngology

## 2022-01-05 ENCOUNTER — Other Ambulatory Visit: Payer: Self-pay

## 2022-01-05 ENCOUNTER — Encounter (HOSPITAL_COMMUNITY): Payer: Self-pay | Admitting: Otolaryngology

## 2022-01-05 DIAGNOSIS — J45909 Unspecified asthma, uncomplicated: Secondary | ICD-10-CM | POA: Insufficient documentation

## 2022-01-05 DIAGNOSIS — Z87891 Personal history of nicotine dependence: Secondary | ICD-10-CM | POA: Insufficient documentation

## 2022-01-05 DIAGNOSIS — G43909 Migraine, unspecified, not intractable, without status migrainosus: Secondary | ICD-10-CM | POA: Diagnosis not present

## 2022-01-05 DIAGNOSIS — M1990 Unspecified osteoarthritis, unspecified site: Secondary | ICD-10-CM | POA: Diagnosis not present

## 2022-01-05 HISTORY — DX: Family history of other specified conditions: Z84.89

## 2022-01-05 HISTORY — DX: Gastro-esophageal reflux disease without esophagitis: K21.9

## 2022-01-05 HISTORY — DX: Unspecified asthma, uncomplicated: J45.909

## 2022-01-05 HISTORY — DX: COVID-19: U07.1

## 2022-01-05 HISTORY — DX: Other specified postprocedural states: Z98.890

## 2022-01-05 HISTORY — DX: Unspecified osteoarthritis, unspecified site: M19.90

## 2022-01-05 HISTORY — PX: ARTERY BIOPSY: SHX891

## 2022-01-05 HISTORY — DX: Anemia, unspecified: D64.9

## 2022-01-05 LAB — CBC
HCT: 38.9 % (ref 36.0–46.0)
Hemoglobin: 12.8 g/dL (ref 12.0–15.0)
MCH: 24.3 pg — ABNORMAL LOW (ref 26.0–34.0)
MCHC: 32.9 g/dL (ref 30.0–36.0)
MCV: 73.8 fL — ABNORMAL LOW (ref 80.0–100.0)
Platelets: 363 10*3/uL (ref 150–400)
RBC: 5.27 MIL/uL — ABNORMAL HIGH (ref 3.87–5.11)
RDW: 14.9 % (ref 11.5–15.5)
WBC: 8.5 10*3/uL (ref 4.0–10.5)
nRBC: 0 % (ref 0.0–0.2)

## 2022-01-05 SURGERY — BIOPSY TEMPORAL ARTERY
Anesthesia: General | Site: Head | Laterality: Right

## 2022-01-05 MED ORDER — PROPOFOL 10 MG/ML IV BOLUS
INTRAVENOUS | Status: AC
  Filled 2022-01-05: qty 20

## 2022-01-05 MED ORDER — DEXAMETHASONE SODIUM PHOSPHATE 10 MG/ML IJ SOLN
INTRAMUSCULAR | Status: AC
  Filled 2022-01-05: qty 5

## 2022-01-05 MED ORDER — LACTATED RINGERS IV SOLN: INTRAVENOUS | Status: DC

## 2022-01-05 MED ORDER — LIDOCAINE-EPINEPHRINE 1 %-1:100000 IJ SOLN
INTRAMUSCULAR | Status: AC
  Filled 2022-01-05: qty 1

## 2022-01-05 MED ORDER — FENTANYL CITRATE (PF) 250 MCG/5ML IJ SOLN
INTRAMUSCULAR | Status: DC | PRN
  Administered 2022-01-05: 25 ug via INTRAVENOUS
  Administered 2022-01-05: 50 ug via INTRAVENOUS
  Administered 2022-01-05: 25 ug via INTRAVENOUS

## 2022-01-05 MED ORDER — BACITRACIN ZINC 500 UNIT/GM EX OINT
TOPICAL_OINTMENT | CUTANEOUS | Status: AC
  Filled 2022-01-05: qty 28.35

## 2022-01-05 MED ORDER — CHLORHEXIDINE GLUCONATE 0.12 % MT SOLN: 15.0000 mL | OROMUCOSAL | Status: AC

## 2022-01-05 MED ORDER — ATROPINE SULFATE 0.4 MG/ML IV SOLN
INTRAVENOUS | Status: AC
  Filled 2022-01-05: qty 1

## 2022-01-05 MED ORDER — CHLORHEXIDINE GLUCONATE 0.12 % MT SOLN
OROMUCOSAL | Status: AC
  Administered 2022-01-05: 15 mL via OROMUCOSAL
  Filled 2022-01-05: qty 15

## 2022-01-05 MED ORDER — ONDANSETRON HCL 4 MG/2ML IJ SOLN
INTRAMUSCULAR | Status: AC
  Filled 2022-01-05: qty 10

## 2022-01-05 MED ORDER — SUCCINYLCHOLINE CHLORIDE 200 MG/10ML IV SOSY
PREFILLED_SYRINGE | INTRAVENOUS | Status: AC
  Filled 2022-01-05: qty 10

## 2022-01-05 MED ORDER — 0.9 % SODIUM CHLORIDE (POUR BTL) OPTIME
TOPICAL | Status: DC | PRN
  Administered 2022-01-05: 1000 mL

## 2022-01-05 MED ORDER — LIDOCAINE-EPINEPHRINE 1 %-1:100000 IJ SOLN
INTRAMUSCULAR | Status: DC | PRN
  Administered 2022-01-05: 1 mL

## 2022-01-05 MED ORDER — MIDAZOLAM HCL 2 MG/2ML IJ SOLN
INTRAMUSCULAR | Status: DC | PRN
  Administered 2022-01-05: 2 mg via INTRAVENOUS

## 2022-01-05 MED ORDER — ONDANSETRON HCL 4 MG/2ML IJ SOLN
INTRAMUSCULAR | Status: DC | PRN
  Administered 2022-01-05: 4 mg via INTRAVENOUS

## 2022-01-05 MED ORDER — PROPOFOL 10 MG/ML IV BOLUS
INTRAVENOUS | Status: DC | PRN
  Administered 2022-01-05 (×3): 20 mg via INTRAVENOUS

## 2022-01-05 MED ORDER — ROCURONIUM BROMIDE 10 MG/ML (PF) SYRINGE
PREFILLED_SYRINGE | INTRAVENOUS | Status: AC
  Filled 2022-01-05: qty 10

## 2022-01-05 MED ORDER — ONDANSETRON HCL 4 MG/2ML IJ SOLN
INTRAMUSCULAR | Status: AC
  Filled 2022-01-05: qty 2

## 2022-01-05 MED ORDER — MIDAZOLAM HCL 2 MG/2ML IJ SOLN
INTRAMUSCULAR | Status: AC
  Filled 2022-01-05: qty 2

## 2022-01-05 MED ORDER — LIDOCAINE 2% (20 MG/ML) 5 ML SYRINGE
INTRAMUSCULAR | Status: AC
  Filled 2022-01-05: qty 5

## 2022-01-05 MED ORDER — PROPOFOL 500 MG/50ML IV EMUL
INTRAVENOUS | Status: DC | PRN
  Administered 2022-01-05: 75 ug/kg/min via INTRAVENOUS

## 2022-01-05 MED ORDER — FENTANYL CITRATE (PF) 250 MCG/5ML IJ SOLN
INTRAMUSCULAR | Status: AC
  Filled 2022-01-05: qty 5

## 2022-01-05 MED ORDER — PROPOFOL 1000 MG/100ML IV EMUL
INTRAVENOUS | Status: AC
  Filled 2022-01-05: qty 100

## 2022-01-05 MED ORDER — CEPHALEXIN 500 MG PO CAPS: 500.0000 mg | ORAL_CAPSULE | Freq: Three times a day (TID) | ORAL | 0 refills | Status: DC

## 2022-01-05 SURGICAL SUPPLY — 54 items
ADH SKN CLS APL DERMABOND .7 (GAUZE/BANDAGES/DRESSINGS) ×1
ATTRACTOMAT 16X20 MAGNETIC DRP (DRAPES) IMPLANT
BAG COUNTER SPONGE SURGICOUNT (BAG) ×1 IMPLANT
BAG SPNG CNTER NS LX DISP (BAG) ×1
BLADE SURG 15 STRL LF DISP TIS (BLADE) IMPLANT
BLADE SURG 15 STRL SS (BLADE)
BNDG CONFORM 2 STRL LF (GAUZE/BANDAGES/DRESSINGS) IMPLANT
BNDG GAUZE DERMACEA FLUFF 4 (GAUZE/BANDAGES/DRESSINGS) IMPLANT
BNDG GZE DERMACEA 4 6PLY (GAUZE/BANDAGES/DRESSINGS)
CANISTER SUCT 3000ML PPV (MISCELLANEOUS) IMPLANT
CATH ROBINSON RED A/P 16FR (CATHETERS) IMPLANT
CLEANER TIP ELECTROSURG 2X2 (MISCELLANEOUS) ×1 IMPLANT
CNTNR URN SCR LID CUP LEK RST (MISCELLANEOUS) ×1 IMPLANT
CONT SPEC 4OZ STRL OR WHT (MISCELLANEOUS) ×1
COVER SURGICAL LIGHT HANDLE (MISCELLANEOUS) ×1 IMPLANT
DERMABOND ADVANCED .7 DNX12 (GAUZE/BANDAGES/DRESSINGS) IMPLANT
DRAIN PENROSE 1/4X12 LTX STRL (WOUND CARE) IMPLANT
DRAPE HALF SHEET 40X57 (DRAPES) IMPLANT
DRSG EMULSION OIL 3X3 NADH (GAUZE/BANDAGES/DRESSINGS) IMPLANT
ELECT COATED BLADE 2.86 ST (ELECTRODE) ×1 IMPLANT
ELECT NDL TIP 2.8 STRL (NEEDLE) IMPLANT
ELECT NEEDLE TIP 2.8 STRL (NEEDLE) IMPLANT
ELECT REM PT RETURN 9FT ADLT (ELECTROSURGICAL) ×1
ELECTRODE REM PT RTRN 9FT ADLT (ELECTROSURGICAL) ×1 IMPLANT
GAUZE 4X4 16PLY ~~LOC~~+RFID DBL (SPONGE) IMPLANT
GAUZE SPONGE 2X2 STRL 8-PLY (GAUZE/BANDAGES/DRESSINGS) IMPLANT
GAUZE SPONGE 4X4 12PLY STRL (GAUZE/BANDAGES/DRESSINGS) IMPLANT
GAUZE SPONGE 4X4 16PLY XRAY LF (GAUZE/BANDAGES/DRESSINGS) IMPLANT
GLOVE ECLIPSE 7.5 STRL STRAW (GLOVE) ×1 IMPLANT
GOWN STRL REUS W/ TWL LRG LVL3 (GOWN DISPOSABLE) ×2 IMPLANT
GOWN STRL REUS W/TWL LRG LVL3 (GOWN DISPOSABLE) ×2
KIT BASIN OR (CUSTOM PROCEDURE TRAY) ×1 IMPLANT
KIT TURNOVER KIT B (KITS) ×1 IMPLANT
NDL 25GX 5/8IN NON SAFETY (NEEDLE) ×1 IMPLANT
NDL PRECISIONGLIDE 27X1.5 (NEEDLE) IMPLANT
NEEDLE 25GX 5/8IN NON SAFETY (NEEDLE) ×1 IMPLANT
NEEDLE PRECISIONGLIDE 27X1.5 (NEEDLE) IMPLANT
NS IRRIG 1000ML POUR BTL (IV SOLUTION) ×1 IMPLANT
PAD ARMBOARD 7.5X6 YLW CONV (MISCELLANEOUS) ×2 IMPLANT
PENCIL FOOT CONTROL (ELECTRODE) ×1 IMPLANT
SUT CHROMIC 4 0 P 3 18 (SUTURE) ×1 IMPLANT
SUT ETHILON 4 0 PS 2 18 (SUTURE) ×1 IMPLANT
SUT ETHILON 5 0 P 3 18 (SUTURE) ×1
SUT NYLON ETHILON 5-0 P-3 1X18 (SUTURE) ×1 IMPLANT
SUT SILK 4 0 REEL (SUTURE) ×1 IMPLANT
SWAB COLLECTION DEVICE MRSA (MISCELLANEOUS) IMPLANT
SWAB CULTURE ESWAB REG 1ML (MISCELLANEOUS) IMPLANT
SYR BULB IRRIG 60ML STRL (SYRINGE) IMPLANT
SYR TB 1ML LUER SLIP (SYRINGE) IMPLANT
TAPE CLOTH SOFT 2X10 (GAUZE/BANDAGES/DRESSINGS) IMPLANT
TOWEL GREEN STERILE FF (TOWEL DISPOSABLE) ×1 IMPLANT
TRAY ENT MC OR (CUSTOM PROCEDURE TRAY) ×1 IMPLANT
WATER STERILE IRR 1000ML POUR (IV SOLUTION) ×1 IMPLANT
YANKAUER SUCT BULB TIP NO VENT (SUCTIONS) IMPLANT

## 2022-01-05 NOTE — Transfer of Care (Signed)
Immediate Anesthesia Transfer of Care Note  Patient: Robin Weaver  Procedure(s) Performed: RIGHT BIOPSY TEMPORAL ARTERY (Right: Head)  Patient Location: PACU  Anesthesia Type:MAC  Level of Consciousness: awake, alert , and oriented  Airway & Oxygen Therapy: Patient Spontanous Breathing  Post-op Assessment: Report given to RN and Post -op Vital signs reviewed and stable  Post vital signs: Reviewed and stable  Last Vitals:  Vitals Value Taken Time  BP 117/68 01/05/22 1618  Temp    Pulse 66 01/05/22 1619  Resp 16 01/05/22 1619  SpO2 98 % 01/05/22 1619  Vitals shown include unvalidated device data.  Last Pain:  Vitals:   01/05/22 1232  TempSrc:   PainSc: 0-No pain      Patients Stated Pain Goal: 0 (79/81/02 5486)  Complications: No notable events documented.

## 2022-01-05 NOTE — Discharge Instructions (Signed)
You may shower and use soap and water. Do not use any creams, oils or ointment. Tylenol. Motrin as needed for pain

## 2022-01-05 NOTE — Anesthesia Preprocedure Evaluation (Signed)
Anesthesia Evaluation  Patient identified by MRN, date of birth, ID band Patient awake    Reviewed: Allergy & Precautions, H&P , NPO status , Patient's Chart, lab work & pertinent test results  History of Anesthesia Complications (+) PONV and history of anesthetic complications  Airway Mallampati: I  TM Distance: >3 FB Neck ROM: Full    Dental  (+) Teeth Intact, Dental Advisory Given   Pulmonary asthma , former smoker   breath sounds clear to auscultation       Cardiovascular negative cardio ROS  Rhythm:Regular     Neuro/Psych  Headaches PSYCHIATRIC DISORDERS Anxiety        GI/Hepatic negative GI ROS, Neg liver ROS,,,  Endo/Other  negative endocrine ROS    Renal/GU negative Renal ROS  negative genitourinary   Musculoskeletal  (+) Arthritis ,    Abdominal   Peds negative pediatric ROS (+)  Hematology negative hematology ROS (+) anemia Lab Results      Component                Value               Date                      WBC                      8.5                 01/05/2022                HGB                      12.8                01/05/2022                HCT                      38.9                01/05/2022                MCV                      73.8 (L)            01/05/2022                PLT                      363                 01/05/2022              Anesthesia Other Findings   Reproductive/Obstetrics negative OB ROS Lab Results      Component                Value               Date                      PREGTESTUR                                   10/15/2008  NEGATIVE        THE SENSITIVITY OF THIS METHODOLOGY IS >24 mIU/mL                              Anesthesia Physical Anesthesia Plan  ASA: 2  Anesthesia Plan: MAC   Post-op Pain Management: Minimal or no pain anticipated   Induction: Intravenous  PONV Risk Score and Plan: 3 and Propofol infusion  and Treatment may vary due to age or medical condition  Airway Management Planned: Nasal Cannula and Natural Airway  Additional Equipment: None  Intra-op Plan:   Post-operative Plan:   Informed Consent: I have reviewed the patients History and Physical, chart, labs and discussed the procedure including the risks, benefits and alternatives for the proposed anesthesia with the patient or authorized representative who has indicated his/her understanding and acceptance.     Dental advisory given  Plan Discussed with: CRNA  Anesthesia Plan Comments:        Anesthesia Quick Evaluation

## 2022-01-05 NOTE — Interval H&P Note (Signed)
History and Physical Interval Note:  01/05/2022 2:55 PM  Robin Weaver  has presented today for surgery, with the diagnosis of Migraine.  The various methods of treatment have been discussed with the patient and family. After consideration of risks, benefits and other options for treatment, the patient has consented to  Procedure(s): BIOPSY TEMPORAL ARTERY (Right) as a surgical intervention.  The patient's history has been reviewed, patient examined, no change in status, stable for surgery.  I have reviewed the patient's chart and labs.  Questions were answered to the patient's satisfaction.     [name]

## 2022-01-05 NOTE — Anesthesia Procedure Notes (Signed)
Procedure Name: MAC Date/Time: 01/05/2022 3:24 PM  Performed by: Carolan Clines, CRNAPre-anesthesia Checklist: Patient identified, Emergency Drugs available, Suction available and Patient being monitored Patient Re-evaluated:Patient Re-evaluated prior to induction Oxygen Delivery Method: Simple face mask Dental Injury: Teeth and Oropharynx as per pre-operative assessment

## 2022-01-06 ENCOUNTER — Encounter (HOSPITAL_COMMUNITY): Payer: Self-pay | Admitting: Otolaryngology

## 2022-01-06 NOTE — Anesthesia Postprocedure Evaluation (Signed)
Anesthesia Post Note  Patient: DEJANEE THIBEAUX  Procedure(s) Performed: RIGHT BIOPSY TEMPORAL ARTERY (Right: Head)     Patient location during evaluation: PACU Anesthesia Type: MAC Level of consciousness: awake and alert Pain management: pain level controlled Vital Signs Assessment: post-procedure vital signs reviewed and stable Respiratory status: spontaneous breathing, nonlabored ventilation and respiratory function stable Cardiovascular status: stable and blood pressure returned to baseline Postop Assessment: no apparent nausea or vomiting Anesthetic complications: no  No notable events documented.  Last Vitals:  Vitals:   01/05/22 1630 01/05/22 1645  BP: 104/60 103/62  Pulse: 64 61  Resp: 16 11  Temp:  36.6 C  SpO2: 99% 98%    Last Pain:  Vitals:   01/05/22 1645  TempSrc:   PainSc: 0-No pain                 Lavoy Bernards

## 2022-01-06 NOTE — Op Note (Signed)
OPERATIVE REPORT  DATE OF SURGERY: 01/06/2022  PATIENT:  Robin Weaver,  46 y.o. female  PRE-OPERATIVE DIAGNOSIS:  Migraine  POST-OPERATIVE DIAGNOSIS:  Migraine  PROCEDURE:  Procedure(s): RIGHT BIOPSY TEMPORAL ARTERY  SURGEON:  Beckie Salts, MD  ASSISTANTS: None  ANESTHESIA:   Monitored anesthesia care with IV sedation  EBL: 20 ml  DRAINS: None  LOCAL MEDICATIONS USED: 1% Xylocaine with epinephrine  SPECIMEN: Right temporal artery  COUNTS:  Correct  PROCEDURE DETAILS: The patient was taken to the operating room and placed on the operating table in the supine position. Following induction of intravenous sedation , the right temple region was prepped and draped in a standard fashion.  The superficial temporal artery was identified by palpation along the anterior temporal hairline.  I was not able to obtain a Doppler signal.  Local anesthetic was applied with a 27-gauge needle.  A #15 scalpel was used to incise the skin and subcutaneous tissue.  Electrocautery was used for hemostasis.  The superficial temporal artery was identified just below the superficial fascia and was ligated between clamps.  A 2-1/2 cm section was exposed and removed and sent for pathologic evaluation.  4-0 silk ties were used to tie off the ends.  Electrocautery was used for completion of hemostasis.  The wound was irrigated with saline and closed in the deep layer using interrupted 4-0 chromic and Dermabond on the skin.  Patient was then transferred back to short stay in stable condition.Marland Kitchen    PATIENT DISPOSITION:  To PACU, stable

## 2022-01-07 LAB — SURGICAL PATHOLOGY

## 2022-01-12 ENCOUNTER — Ambulatory Visit (HOSPITAL_COMMUNITY)
Admission: RE | Admit: 2022-01-12 | Discharge: 2022-01-12 | Disposition: A | Discharge status: Home / Self Care | Payer: BC Managed Care – PPO | Source: Ambulatory Visit | Attending: Nurse Practitioner | Admitting: Nurse Practitioner

## 2022-01-12 DIAGNOSIS — I6521 Occlusion and stenosis of right carotid artery: Secondary | ICD-10-CM | POA: Insufficient documentation

## 2022-01-12 DIAGNOSIS — R7982 Elevated C-reactive protein (CRP): Secondary | ICD-10-CM | POA: Diagnosis not present

## 2022-01-12 DIAGNOSIS — R0989 Other specified symptoms and signs involving the circulatory and respiratory systems: Secondary | ICD-10-CM | POA: Diagnosis not present

## 2022-01-12 DIAGNOSIS — G44221 Chronic tension-type headache, intractable: Secondary | ICD-10-CM | POA: Diagnosis not present

## 2022-01-25 DIAGNOSIS — Z23 Encounter for immunization: Secondary | ICD-10-CM | POA: Diagnosis not present

## 2022-01-25 DIAGNOSIS — E611 Iron deficiency: Secondary | ICD-10-CM | POA: Diagnosis not present

## 2022-01-25 DIAGNOSIS — R790 Abnormal level of blood mineral: Secondary | ICD-10-CM | POA: Diagnosis not present

## 2022-01-27 DIAGNOSIS — R69 Illness, unspecified: Secondary | ICD-10-CM | POA: Diagnosis not present

## 2022-01-27 DIAGNOSIS — K219 Gastro-esophageal reflux disease without esophagitis: Secondary | ICD-10-CM | POA: Diagnosis not present

## 2022-01-27 DIAGNOSIS — R7982 Elevated C-reactive protein (CRP): Secondary | ICD-10-CM | POA: Diagnosis not present

## 2022-01-27 DIAGNOSIS — E611 Iron deficiency: Secondary | ICD-10-CM | POA: Diagnosis not present

## 2022-01-27 DIAGNOSIS — G4700 Insomnia, unspecified: Secondary | ICD-10-CM | POA: Diagnosis not present

## 2022-02-10 DIAGNOSIS — J329 Chronic sinusitis, unspecified: Secondary | ICD-10-CM | POA: Diagnosis not present

## 2022-02-10 DIAGNOSIS — J069 Acute upper respiratory infection, unspecified: Secondary | ICD-10-CM | POA: Diagnosis not present

## 2022-02-10 DIAGNOSIS — J45909 Unspecified asthma, uncomplicated: Secondary | ICD-10-CM | POA: Diagnosis not present

## 2022-02-10 DIAGNOSIS — B9689 Other specified bacterial agents as the cause of diseases classified elsewhere: Secondary | ICD-10-CM | POA: Diagnosis not present

## 2022-02-12 DIAGNOSIS — J029 Acute pharyngitis, unspecified: Secondary | ICD-10-CM | POA: Diagnosis not present

## 2022-02-12 DIAGNOSIS — Z6832 Body mass index (BMI) 32.0-32.9, adult: Secondary | ICD-10-CM | POA: Diagnosis not present

## 2022-02-12 DIAGNOSIS — T3695XA Adverse effect of unspecified systemic antibiotic, initial encounter: Secondary | ICD-10-CM | POA: Diagnosis not present

## 2022-02-12 DIAGNOSIS — B379 Candidiasis, unspecified: Secondary | ICD-10-CM | POA: Diagnosis not present

## 2022-02-12 DIAGNOSIS — H66001 Acute suppurative otitis media without spontaneous rupture of ear drum, right ear: Secondary | ICD-10-CM | POA: Diagnosis not present

## 2022-02-16 DIAGNOSIS — G43909 Migraine, unspecified, not intractable, without status migrainosus: Secondary | ICD-10-CM | POA: Diagnosis not present

## 2022-02-16 DIAGNOSIS — E611 Iron deficiency: Secondary | ICD-10-CM | POA: Diagnosis not present

## 2022-02-18 ENCOUNTER — Encounter: Payer: Self-pay | Admitting: Hematology and Oncology

## 2022-02-18 DIAGNOSIS — E611 Iron deficiency: Secondary | ICD-10-CM | POA: Insufficient documentation

## 2022-02-21 ENCOUNTER — Other Ambulatory Visit: Payer: Self-pay

## 2022-02-21 ENCOUNTER — Inpatient Hospital Stay: Payer: No Typology Code available for payment source

## 2022-02-21 ENCOUNTER — Encounter: Payer: Self-pay | Admitting: Hematology and Oncology

## 2022-02-21 ENCOUNTER — Inpatient Hospital Stay
Payer: No Typology Code available for payment source | Attending: Hematology and Oncology | Admitting: Hematology and Oncology

## 2022-02-21 VITALS — BP 123/76 | HR 78 | Temp 97.9°F | Resp 18 | Ht 69.0 in | Wt 218.8 lb

## 2022-02-21 DIAGNOSIS — I808 Phlebitis and thrombophlebitis of other sites: Secondary | ICD-10-CM | POA: Diagnosis not present

## 2022-02-21 DIAGNOSIS — Z87891 Personal history of nicotine dependence: Secondary | ICD-10-CM | POA: Diagnosis not present

## 2022-02-21 DIAGNOSIS — G8929 Other chronic pain: Secondary | ICD-10-CM | POA: Diagnosis not present

## 2022-02-21 DIAGNOSIS — Z79899 Other long term (current) drug therapy: Secondary | ICD-10-CM | POA: Insufficient documentation

## 2022-02-21 DIAGNOSIS — Z7982 Long term (current) use of aspirin: Secondary | ICD-10-CM | POA: Insufficient documentation

## 2022-02-21 DIAGNOSIS — M549 Dorsalgia, unspecified: Secondary | ICD-10-CM | POA: Diagnosis not present

## 2022-02-21 DIAGNOSIS — E611 Iron deficiency: Secondary | ICD-10-CM | POA: Diagnosis not present

## 2022-02-21 DIAGNOSIS — M79601 Pain in right arm: Secondary | ICD-10-CM | POA: Diagnosis not present

## 2022-02-21 NOTE — Assessment & Plan Note (Signed)
The patient has failed outpatient oral replacement therapy She have symptoms of excessive fatigue Her baseline hemoglobin is around 14 Even though her recent blood work showed no evidence of anemia she is profoundly iron deficient  The most likely cause of her anemia is due to chronic blood loss/malabsorption syndrome. We discussed some of the risks, benefits, and alternatives of intravenous iron infusions. The patient is symptomatic from anemia and the iron level is critically low. She tolerated oral iron supplement poorly and desires to achieved higher levels of iron faster for adequate hematopoesis. Some of the side-effects to be expected including risks of infusion reactions, phlebitis, headaches, nausea and fatigue.  The patient is willing to proceed. Patient education material was dispensed.  Goal is to keep ferritin level greater than 50 and resolution of anemia  I believe the proton pump inhibitor is contributing to mild absorption Even though she has no signs of occult bleeding, she is due for repeat colonoscopy soon due to strong family history of cancer and I recommend EGD evaluation as well to rule out GI source of bleeding I plan to see her again in 3 months for further follow-up

## 2022-02-21 NOTE — Progress Notes (Signed)
[name]  Patient Care Team: Fanny Bien, MD as PCP - General (Family Medicine) Daryll Brod, MD as Consulting Physician (Orthopedic Surgery)  ASSESSMENT & PLAN:  Iron deficiency The patient has failed outpatient oral replacement therapy She have symptoms of excessive fatigue Her baseline hemoglobin is around 14 Even though her recent blood work showed no evidence of anemia she is profoundly iron deficient  The most likely cause of her anemia is due to chronic blood loss/malabsorption syndrome. We discussed some of the risks, benefits, and alternatives of intravenous iron infusions. The patient is symptomatic from anemia and the iron level is critically low. She tolerated oral iron supplement poorly and desires to achieved higher levels of iron faster for adequate hematopoesis. Some of the side-effects to be expected including risks of infusion reactions, phlebitis, headaches, nausea and fatigue.  The patient is willing to proceed. Patient education material was dispensed.  Goal is to keep ferritin level greater than 50 and resolution of anemia  I believe the proton pump inhibitor is contributing to mild absorption Even though she has no signs of occult bleeding, she is due for repeat colonoscopy soon due to strong family history of cancer and I recommend EGD evaluation as well to rule out GI source of bleeding I plan to see her again in 3 months for further follow-up  Orders Placed This Encounter  Procedures   Iron and Iron Binding Capacity ([facility] only)    Standing Status:   Future    Standing Expiration Date:   02/22/2023   Ferritin    Standing Status:   Future    Standing Expiration Date:   02/21/2023   CBC with Differential (Cancer Center Only)    Standing Status:   Future    Standing Expiration Date:   02/22/2023   Reticulocytes    Standing Status:   Future    Standing Expiration Date:   02/22/2023    All questions were answered. The patient  knows to call the clinic with any problems, questions or concerns.  The total time spent in the appointment was 60 minutes encounter with patients including review of chart and various tests results, discussions about plan of care and coordination of care plan  Heath Lark, MD 2/5/20243:04 PM   CHIEF COMPLAINTS/PURPOSE OF CONSULTATION:  Anemia  HISTORY OF PRESENTING ILLNESS:  Robin Weaver 47 y.o. female is here because of anemia  She was found to have abnormal CBC from recent blood work Technically, she is not anemic but have microcytosis I have the opportunity to review her baseline CBC Summary of her blood count is as follows January 22, 2008, hemoglobin 13.4 October 15, 2008, hemoglobin 14 December 11, 2011, hemoglobin 15.1 October 28, 2015, hemoglobin 14.7 February 28, 2019, hemoglobin 13.9 January 05, 2022, hemoglobin 12.8, MCV 73.8, iron 39 and ferritin 4.7  She denies recent chest pain on exertion, shortness of breath on minimal exertion, pre-syncopal episodes, or palpitations. However, she complained of excessive fatigue despite sleeping for many hours a day She have difficulties functioning on a day-to-day basis She had not noticed any recent bleeding such as epistaxis, hematuria or hematochezia The patient denies over the counter NSAID ingestion. She is taking Mobic long-term for chronic joint pain.  She is also prescribed 81 mg aspirin due to severe migraine headache and also evaluation to rule out temporal arteritis. Her last colonoscopy was several years ago.  She denies abnormal report from colonoscopy  She has remote history of  abnormal Pap smear.  In December 2010, she underwent hysterectomy.  Pathology did not show invasive cervical cancer and she have normal evaluation since then Her age appropriate screening programs are up-to-date. She denies any pica and eats a variety of diet. She never donated blood or received blood transfusion The patient was  prescribed oral iron supplements and she takes twice a day for several months without significant improvement of her blood count  MEDICAL HISTORY:  Past Medical History:  Diagnosis Date   Anemia    Anxiety    Arthritis    upper spine   Asthma    Breast pain, left    left diffuse breast pain x 10/2015   Cervical cancer ([place])    Treated with TLH/BS, pathology from specimen only with CIN III   Chronic back pain    COVID    January 2023   Depression    Family history of adverse reaction to anesthesia    mother has trouble waking up and nausea   GERD (gastroesophageal reflux disease)    Migraine without aura    PCOS (polycystic ovarian syndrome)    PONV (postoperative nausea and vomiting)    TMJ disease    Tremor    left hand    SURGICAL HISTORY: Past Surgical History:  Procedure Laterality Date   ARTERY BIOPSY Right 01/05/2022   Procedure: RIGHT BIOPSY TEMPORAL ARTERY;  Surgeon: [name], MD;  Location: [place];  Service: ENT;  Laterality: Right;   ROBOTIC ASSISTED TOTAL HYSTERECTOMY     With BS, had a ureteral injury, treated with a stent.    WRIST SURGERY      SOCIAL HISTORY: Social History   Socioeconomic History   Marital status: Significant Other    Spouse name: Not on file   Number of children: 1   Years of education: some college   Highest education level: Not on file  Occupational History   Occupation: Client care specialist  Tobacco Use   Smoking status: Former    Packs/day: 0.50    Years: 12.00    Total pack years: 6.00    Types: Cigarettes    Quit date: 11/2015    Years since quitting: 6.2   Smokeless tobacco: Never  Vaping Use   Vaping Use: Never used  Substance and Sexual Activity   Alcohol use: Yes    Comment: occasional   Drug use: No   Sexual activity: Yes    Birth control/protection: Surgical    Comment: Hysterectomy  Other Topics Concern   Not on file  Social History Narrative   Lives at home with significant other.    Right-handed.   Caffeine use: occasional   Social Determinants of Radio broadcast assistant Strain: Not on file  Food Insecurity: Not on file  Transportation Needs: Not on file  Physical Activity: Not on file  Stress: Not on file  Social Connections: Not on file  Intimate Partner Violence: Not on file    FAMILY HISTORY: Family History  Problem Relation Age of Onset   Hypertension Mother    Hypertension Father    Bladder Cancer Father    Colon cancer Maternal Grandmother     ALLERGIES:  is allergic to vicodin [hydrocodone-acetaminophen].  MEDICATIONS:  Current Outpatient Medications  Medication Sig Dispense Refill   albuterol (PROVENTIL HFA;VENTOLIN HFA) 108 (90 BASE) MCG/ACT inhaler Inhale 2 puffs into the lungs every 6 (six) hours as needed for wheezing or shortness of breath. For asthma  ALPRAZolam (XANAX) 0.5 MG tablet Take 0.5 mg by mouth 2 (two) times daily as needed for anxiety or sleep.     aspirin EC 81 MG tablet Take 81 mg by mouth at bedtime. Swallow whole.     budesonide-formoterol (SYMBICORT) 160-4.5 MCG/ACT inhaler Inhale 2 puffs into the lungs 2 (two) times daily.     cholecalciferol (VITAMIN D3) 25 MCG (1000 UNIT) tablet Take 1,000 Units by mouth daily.     drospirenone-ethinyl estradiol (GIANVI) 3-0.02 MG tablet TAKE CONTINUOUSLY, SKIP THE LAST 4 DAYS OF PLACE[place] 112 tablet 3   EPINEPHrine 0.3 mg/0.3 mL IJ SOAJ injection Inject 0.3 mg into the muscle as needed for anaphylaxis. 1 each 0   escitalopram (LEXAPRO) 20 MG tablet Take 20 mg by mouth at bedtime.     ferrous sulfate 325 (65 FE) MG tablet Take 325 mg by mouth 2 (two) times daily with a meal.     meloxicam (MOBIC) 15 MG tablet Take 15 mg by mouth daily.     montelukast (SINGULAIR) 10 MG tablet Take 10 mg by mouth at bedtime.     omeprazole (PRILOSEC) 20 MG capsule Take 20 mg by mouth at bedtime.     oxybutynin (DITROPAN-XL) 5 MG 24 hr tablet Take 5 mg by mouth at bedtime.     Prenatal Vit-Fe  Fumarate-FA (PRENATAL VITAMINS) 28-0.8 MG TABS Take 1 tablet by mouth daily.     rizatriptan (MAXALT-MLT) 5 MG disintegrating tablet Take 5 mg by mouth as needed for migraine.     SAXENDA 18 MG/3ML SOPN Inject 3 mg into the skin once a week.     topiramate (TOPAMAX) 100 MG tablet Take 100 mg by mouth at bedtime.     No current facility-administered medications for this visit.    REVIEW OF SYSTEMS:   Constitutional: Denies fevers, chills or abnormal night sweats Eyes: Denies blurriness of vision, double vision or watery eyes Ears, nose, mouth, throat, and face: Denies mucositis or sore throat Respiratory: Denies cough, dyspnea or wheezes Cardiovascular: Denies palpitation, chest discomfort or lower extremity swelling Gastrointestinal:  Denies nausea, heartburn or change in bowel habits Skin: Denies abnormal skin rashes Lymphatics: Denies new lymphadenopathy or easy bruising Neurological:Denies numbness, tingling or new weaknesses Behavioral/Psych: Mood is stable, no new changes  All other systems were reviewed with the patient and are negative.  PHYSICAL EXAMINATION: ECOG PERFORMANCE STATUS: 1 - Symptomatic but completely ambulatory  Vitals:   02/21/22 1200  BP: 123/76  Pulse: 78  Resp: 18  Temp: 97.9 F (36.6 C)  SpO2: 99%   Filed Weights   02/21/22 1200  Weight: 218 lb 12.8 oz (99.2 kg)    GENERAL:alert, no distress and comfortable SKIN: skin color, texture, turgor are normal, no rashes or significant lesions EYES: normal, conjunctiva are pink and non-injected, sclera clear OROPHARYNX:no exudate, no erythema and lips, buccal mucosa, and tongue normal  NECK: supple, thyroid normal size, non-tender, without nodularity LYMPH:  no palpable lymphadenopathy in the cervical, axillary or inguinal LUNGS: clear to auscultation and percussion with normal breathing effort HEART: regular rate & rhythm and no murmurs and no lower extremity edema ABDOMEN:abdomen soft, non-tender and  normal bowel sounds Musculoskeletal:no cyanosis of digits and no clubbing  PSYCH: alert & oriented x 3 with fluent speech NEURO: no focal motor/sensory deficits

## 2022-02-28 DIAGNOSIS — E611 Iron deficiency: Secondary | ICD-10-CM | POA: Diagnosis not present

## 2022-02-28 DIAGNOSIS — U071 COVID-19: Secondary | ICD-10-CM | POA: Diagnosis not present

## 2022-02-28 DIAGNOSIS — R030 Elevated blood-pressure reading, without diagnosis of hypertension: Secondary | ICD-10-CM | POA: Diagnosis not present

## 2022-03-04 ENCOUNTER — Other Ambulatory Visit: Payer: Self-pay

## 2022-03-04 ENCOUNTER — Inpatient Hospital Stay: Payer: No Typology Code available for payment source

## 2022-03-04 VITALS — BP 142/81 | HR 68 | Temp 97.8°F | Resp 18

## 2022-03-04 DIAGNOSIS — M549 Dorsalgia, unspecified: Secondary | ICD-10-CM | POA: Diagnosis not present

## 2022-03-04 DIAGNOSIS — Z87891 Personal history of nicotine dependence: Secondary | ICD-10-CM | POA: Diagnosis not present

## 2022-03-04 DIAGNOSIS — M79601 Pain in right arm: Secondary | ICD-10-CM | POA: Diagnosis not present

## 2022-03-04 DIAGNOSIS — E611 Iron deficiency: Secondary | ICD-10-CM

## 2022-03-04 DIAGNOSIS — Z79899 Other long term (current) drug therapy: Secondary | ICD-10-CM | POA: Diagnosis not present

## 2022-03-04 DIAGNOSIS — G8929 Other chronic pain: Secondary | ICD-10-CM | POA: Diagnosis not present

## 2022-03-04 DIAGNOSIS — Z7982 Long term (current) use of aspirin: Secondary | ICD-10-CM | POA: Diagnosis not present

## 2022-03-04 DIAGNOSIS — I808 Phlebitis and thrombophlebitis of other sites: Secondary | ICD-10-CM | POA: Diagnosis not present

## 2022-03-04 MED ORDER — SODIUM CHLORIDE 0.9 % IV SOLN: Freq: Once | INTRAVENOUS | Status: AC

## 2022-03-04 MED ORDER — SODIUM CHLORIDE 0.9 % IV SOLN
400.0000 mg | Freq: Once | INTRAVENOUS | Status: AC
  Administered 2022-03-04: 400 mg via INTRAVENOUS
  Filled 2022-03-04: qty 20

## 2022-03-04 NOTE — Patient Instructions (Signed)

## 2022-03-08 ENCOUNTER — Ambulatory Visit (HOSPITAL_BASED_OUTPATIENT_CLINIC_OR_DEPARTMENT_OTHER)
Admission: RE | Admit: 2022-03-08 | Discharge: 2022-03-08 | Disposition: A | Discharge status: Home / Self Care | Payer: No Typology Code available for payment source | Source: Ambulatory Visit | Attending: Physician Assistant | Admitting: Physician Assistant

## 2022-03-08 ENCOUNTER — Other Ambulatory Visit: Payer: Self-pay

## 2022-03-08 ENCOUNTER — Inpatient Hospital Stay (HOSPITAL_BASED_OUTPATIENT_CLINIC_OR_DEPARTMENT_OTHER): Payer: No Typology Code available for payment source | Admitting: Physician Assistant

## 2022-03-08 VITALS — BP 128/82 | HR 74 | Temp 98.6°F | Resp 14 | Wt 223.0 lb

## 2022-03-08 DIAGNOSIS — I808 Phlebitis and thrombophlebitis of other sites: Secondary | ICD-10-CM | POA: Diagnosis not present

## 2022-03-08 DIAGNOSIS — E611 Iron deficiency: Secondary | ICD-10-CM | POA: Diagnosis not present

## 2022-03-08 DIAGNOSIS — M549 Dorsalgia, unspecified: Secondary | ICD-10-CM | POA: Diagnosis not present

## 2022-03-08 DIAGNOSIS — M79601 Pain in right arm: Secondary | ICD-10-CM | POA: Diagnosis not present

## 2022-03-08 DIAGNOSIS — Z79899 Other long term (current) drug therapy: Secondary | ICD-10-CM | POA: Diagnosis not present

## 2022-03-08 DIAGNOSIS — Z87891 Personal history of nicotine dependence: Secondary | ICD-10-CM | POA: Diagnosis not present

## 2022-03-08 DIAGNOSIS — G8929 Other chronic pain: Secondary | ICD-10-CM | POA: Diagnosis not present

## 2022-03-08 DIAGNOSIS — Z7982 Long term (current) use of aspirin: Secondary | ICD-10-CM | POA: Diagnosis not present

## 2022-03-08 NOTE — Progress Notes (Signed)
Symptom Management Consult note [place]    Patient Care Team: Fanny Bien, MD as PCP - General (Family Medicine) Daryll Brod, MD as Consulting Physician (Orthopedic Surgery)    Name / MRN / DOB: Robin Weaver  [id number]  07-Jul-1975   Date of visit: 03/08/2022   Chief Complaint/Reason for visit: knot on arm   Current Therapy: Venofer 400 mg  Last treatment:  Treatment 1 on 03/04/22   ASSESSMENT & PLAN: Patient is a 47 y.o. female  with pertinent history of anemia followed by Dr. Alvy Bimler.  I have viewed most recent oncology note and lab work.    #Anemia - Recent had first iron infusion - Next appointment with heme/onc is 05/26/22   #Right arm pain -Patient well appearing. Exam without cellulitis. She is neurovascularly intact.  - HPI concern for superficial thrombophlebitis  - DVT study obtained and is positive for a thrombus is noted in a superficial vein located in the posterior hand/wrist. No findings of DVT. -Discussed OTC symptomatic management  Strict ED precautions discussed should symptoms worsen.   Heme/Onc History: Oncology History   No history exists.      Interval history-: Robin Weaver is a 47 y.o. female RHD with pertinent history of anemia presenting to [facility] today with chief complaint of knot on right wrist x 3 days. She presents unaccompanied to clinic today.  She had first iron infusion x 4 days ago. She states the infusion went well.  There is no difficulty obtaining IV access and she did not have any pain during the infusion.  When she woke up the next morning she noticed some swelling in her right wrist and associated tenderness.  She denies any pain at rest, the tenderness is only present with movement.  With movement she endorses throbbing pain.  Pain does not radiate.  She rates pain 6 out of 10 in severity.  She last took ibuprofen yesterday with minimal symptom relief.  She also tried applying heat and ice  without much relief either.  She denies any fever or chills, numbness or tingling, chest pain or swelling.   ROS  All other systems are reviewed and are negative for acute change except as noted in the HPI.    Allergies  Allergen Reactions   Vicodin [Hydrocodone-Acetaminophen] Nausea And Vomiting     Past Medical History:  Diagnosis Date   Anemia    Anxiety    Arthritis    upper spine   Asthma    Breast pain, left    left diffuse breast pain x 10/2015   Cervical cancer ([place])    Treated with TLH/BS, pathology from specimen only with CIN III   Chronic back pain    COVID    January 2023   Depression    Family history of adverse reaction to anesthesia    mother has trouble waking up and nausea   GERD (gastroesophageal reflux disease)    Migraine without aura    PCOS (polycystic ovarian syndrome)    PONV (postoperative nausea and vomiting)    TMJ disease    Tremor    left hand     Past Surgical History:  Procedure Laterality Date   ARTERY BIOPSY Right 01/05/2022   Procedure: RIGHT BIOPSY TEMPORAL ARTERY;  Surgeon: [name], MD;  Location: [facility];  Service: ENT;  Laterality: Right;   ROBOTIC ASSISTED TOTAL HYSTERECTOMY     With BS, had a ureteral injury, treated with a  stent.    WRIST SURGERY      Social History   Socioeconomic History   Marital status: Significant Other    Spouse name: Not on file   Number of children: 1   Years of education: some college   Highest education level: Not on file  Occupational History   Occupation: Client care specialist  Tobacco Use   Smoking status: Former    Packs/day: 0.50    Years: 12.00    Total pack years: 6.00    Types: Cigarettes    Quit date: 11/2015    Years since quitting: 6.3   Smokeless tobacco: Never  Vaping Use   Vaping Use: Never used  Substance and Sexual Activity   Alcohol use: Yes    Comment: occasional   Drug use: No   Sexual activity: Yes    Birth control/protection: Surgical    Comment:  Hysterectomy  Other Topics Concern   Not on file  Social History Narrative   Lives at home with significant other.   Right-handed.   Caffeine use: occasional   Social Determinants of Radio broadcast assistant Strain: Not on file  Food Insecurity: Not on file  Transportation Needs: Not on file  Physical Activity: Not on file  Stress: Not on file  Social Connections: Not on file  Intimate Partner Violence: Not on file    Family History  Problem Relation Age of Onset   Hypertension Mother    Hypertension Father    Bladder Cancer Father    Colon cancer Maternal Grandmother      Current Outpatient Medications:    albuterol (PROVENTIL HFA;VENTOLIN HFA) 108 (90 BASE) MCG/ACT inhaler, Inhale 2 puffs into the lungs every 6 (six) hours as needed for wheezing or shortness of breath. For asthma, Disp: , Rfl:    ALPRAZolam (XANAX) 0.5 MG tablet, Take 0.5 mg by mouth 2 (two) times daily as needed for anxiety or sleep., Disp: , Rfl:    aspirin EC 81 MG tablet, Take 81 mg by mouth at bedtime. Swallow whole., Disp: , Rfl:    budesonide-formoterol (SYMBICORT) 160-4.5 MCG/ACT inhaler, Inhale 2 puffs into the lungs 2 (two) times daily., Disp: , Rfl:    cholecalciferol (VITAMIN D3) 25 MCG (1000 UNIT) tablet, Take 1,000 Units by mouth daily., Disp: , Rfl:    drospirenone-ethinyl estradiol (GIANVI) 3-0.02 MG tablet, TAKE CONTINUOUSLY, SKIP THE LAST 4 DAYS OF [place], Disp: 112 tablet, Rfl: 3   EPINEPHrine 0.3 mg/0.3 mL IJ SOAJ injection, Inject 0.3 mg into the muscle as needed for anaphylaxis., Disp: 1 each, Rfl: 0   escitalopram (LEXAPRO) 20 MG tablet, Take 20 mg by mouth at bedtime., Disp: , Rfl:    ferrous sulfate 325 (65 FE) MG tablet, Take 325 mg by mouth 2 (two) times daily with a meal., Disp: , Rfl:    meloxicam (MOBIC) 15 MG tablet, Take 15 mg by mouth daily., Disp: , Rfl:    montelukast (SINGULAIR) 10 MG tablet, Take 10 mg by mouth at bedtime., Disp: , Rfl:    omeprazole (PRILOSEC) 20 MG  capsule, Take 20 mg by mouth at bedtime., Disp: , Rfl:    oxybutynin (DITROPAN-XL) 5 MG 24 hr tablet, Take 5 mg by mouth at bedtime., Disp: , Rfl:    Prenatal Vit-Fe Fumarate-FA (PRENATAL VITAMINS) 28-0.8 MG TABS, Take 1 tablet by mouth daily., Disp: , Rfl:    rizatriptan (MAXALT-MLT) 5 MG disintegrating tablet, Take 5 mg by mouth as needed for migraine., Disp: ,  Rfl:    SAXENDA 18 MG/3ML SOPN, Inject 3 mg into the skin once a week., Disp: , Rfl:    topiramate (TOPAMAX) 100 MG tablet, Take 100 mg by mouth at bedtime., Disp: , Rfl:   PHYSICAL EXAM: ECOG FS:1 - Symptomatic but completely ambulatory    Vitals:   03/08/22 1019  BP: 128/82  Pulse: 74  Resp: 14  Temp: 98.6 F (37 C)  TempSrc: Oral  SpO2: 100%  Weight: 223 lb (101.2 kg)   Physical Exam Vitals and nursing note reviewed.  Constitutional:      Appearance: She is well-developed. She is not ill-appearing or toxic-appearing.  HENT:     Head: Normocephalic.  Eyes:     Conjunctiva/sclera: Conjunctivae normal.  Neck:     Vascular: No JVD.  Cardiovascular:     Rate and Rhythm: Normal rate and regular rhythm.     Pulses: Normal pulses.          Radial pulses are 2+ on the left side.     Heart sounds: Normal heart sounds.  Pulmonary:     Effort: Pulmonary effort is normal.     Breath sounds: Normal breath sounds.  Musculoskeletal:        General: Normal range of motion.     Cervical back: Normal range of motion.     Comments: Compartments soft in right upper extremity.   Approximately 2 cm area of induration on dorsal aspect of right wrist with mild overlying swelling. No erythema or warmth  Skin:    General: Skin is warm and dry.     Capillary Refill: Capillary refill takes less than 2 seconds.     Findings: No rash.  Neurological:     Mental Status: She is oriented to person, place, and time.        LABORATORY DATA: I have reviewed the data as listed    Latest Ref Rng & Units 01/05/2022   12:51 PM  02/28/2019    8:38 AM 10/28/2015    9:30 AM  CBC  WBC 4.0 - 10.5 K/uL 8.5  6.8  5.7   Hemoglobin 12.0 - 15.0 g/dL 12.8  13.9  14.7   Hematocrit 36.0 - 46.0 % 38.9  42.4  43.4   Platelets 150 - 400 K/uL 363   241         Latest Ref Rng & Units 02/28/2019    8:38 AM 10/28/2015    9:30 AM 12/11/2011   10:35 AM  CMP  Glucose 65 - 99 mg/dL 98  103  90   BUN 6 - 24 mg/dL 16  13  12   $ Creatinine 0.57 - 1.00 mg/dL 0.98  0.77  0.69   Sodium 134 - 144 mmol/L 141  138  135   Potassium 3.5 - 5.2 mmol/L 4.7  4.1  4.2   Chloride 96 - 106 mmol/L 107  106  99   CO2 20 - 29 mmol/L 18  24  27   $ Calcium 8.7 - 10.2 mg/dL 9.1  9.0  9.7   Total Protein 6.0 - 8.5 g/dL 6.8   7.5   Total Bilirubin 0.0 - 1.2 mg/dL <0.2   0.3   Alkaline Phos 39 - 117 IU/L 84   94   AST 0 - 40 IU/L 9   16   ALT 0 - 32 IU/L 10   18        RADIOGRAPHIC STUDIES (from last 24 hours if applicable) I have personally  reviewed the radiological images as listed and agreed with the findings in the report. VAS [place] UPPER EXTREMITY VENOUS DUPLEX  Result Date: 03/08/2022 UPPER VENOUS STUDY  Patient Name:  Robin Weaver  Date of Exam:   03/08/2022 Medical Rec #: [id number]         Accession #:    [id number] Date of Birth: [birth year]         Patient Gender: F Patient Age:   46 years Exam Location:  [facility] Procedure:      VAS [place] UPPER EXTREMITY VENOUS DUPLEX Referring Phys: Sherol Dade --------------------------------------------------------------------------------  Indications: Pain Risk Factors: Cancer Trauma. Comparison Study: No prior studies. Performing Technologist: Oliver Hum RVT  Examination Guidelines: A complete evaluation includes B-mode imaging, spectral Doppler, color Doppler, and power Doppler as needed of all accessible portions of each vessel. Bilateral testing is considered an integral part of a complete examination. Limited examinations for reoccurring indications may be performed as noted.  Right  Findings: +----------+------------+---------+-----------+----------+-------+ RIGHT     CompressiblePhasicitySpontaneousPropertiesSummary +----------+------------+---------+-----------+----------+-------+ IJV           Full       Yes       Yes                      +----------+------------+---------+-----------+----------+-------+ Subclavian    Full       Yes       Yes                      +----------+------------+---------+-----------+----------+-------+ Axillary      Full       Yes       Yes                      +----------+------------+---------+-----------+----------+-------+ Brachial      Full       Yes       Yes                      +----------+------------+---------+-----------+----------+-------+ Radial        Full                                          +----------+------------+---------+-----------+----------+-------+ Ulnar         Full                                          +----------+------------+---------+-----------+----------+-------+ Cephalic      Full                                          +----------+------------+---------+-----------+----------+-------+ Basilic       Full                                          +----------+------------+---------+-----------+----------+-------+ A thrombus is noted in a superficial vein located in the posterior hand/wrist.  Left Findings: +----------+------------+---------+-----------+----------+-------+ LEFT      CompressiblePhasicitySpontaneousPropertiesSummary +----------+------------+---------+-----------+----------+-------+ Subclavian    Full       Yes       Yes                      +----------+------------+---------+-----------+----------+-------+  Summary:  Right: No evidence of deep vein thrombosis in the upper extremity. A thrombus is noted in a superficial vein located in the posterior hand/wrist.  Left: No evidence of thrombosis in the subclavian.  *See table(s) above for  measurements and observations.  Diagnosing physician: Deitra Mayo MD Electronically signed by Deitra Mayo MD on 03/08/2022 at 12:10:50 PM.    Final         Visit Diagnosis: 1. Superficial thrombophlebitis of right upper extremity   2. Iron deficiency      No orders of the defined types were placed in this encounter.   All questions were answered. The patient knows to call the clinic with any problems, questions or concerns. No barriers to learning was detected.  I have spent a total of 20 minutes minutes of face-to-face and non-face-to-face time, preparing to see the patient, obtaining and/or reviewing separately obtained history, performing a medically appropriate examination, counseling and educating the patient, ordering tests, documenting clinical information in the electronic health record.  Thank you for allowing me to participate in the care of this patient.    Barrie Folk, PA-C Department of Hematology/Oncology [facility] at [facility] Phone: [phone]  [phone]    03/08/2022 3:48 PM

## 2022-03-08 NOTE — Progress Notes (Signed)
Right upper extremity venous duplex has been completed. Preliminary results can be found in CV Proc through chart review.  Results were given to [facility] PA.  03/08/22 11:16 AM Carlos Levering RVT

## 2022-03-16 ENCOUNTER — Encounter: Payer: Self-pay | Admitting: Hematology and Oncology

## 2022-04-04 ENCOUNTER — Other Ambulatory Visit: Payer: Self-pay | Admitting: Family Medicine

## 2022-04-04 DIAGNOSIS — E282 Polycystic ovarian syndrome: Secondary | ICD-10-CM | POA: Diagnosis not present

## 2022-04-04 DIAGNOSIS — Z3009 Encounter for other general counseling and advice on contraception: Secondary | ICD-10-CM | POA: Diagnosis not present

## 2022-04-04 DIAGNOSIS — Z1231 Encounter for screening mammogram for malignant neoplasm of breast: Secondary | ICD-10-CM

## 2022-04-04 DIAGNOSIS — Z1211 Encounter for screening for malignant neoplasm of colon: Secondary | ICD-10-CM | POA: Diagnosis not present

## 2022-05-05 DIAGNOSIS — G44229 Chronic tension-type headache, not intractable: Secondary | ICD-10-CM | POA: Insufficient documentation

## 2022-05-05 DIAGNOSIS — Z13220 Encounter for screening for lipoid disorders: Secondary | ICD-10-CM | POA: Diagnosis not present

## 2022-05-05 DIAGNOSIS — Z114 Encounter for screening for human immunodeficiency virus [HIV]: Secondary | ICD-10-CM | POA: Diagnosis not present

## 2022-05-05 DIAGNOSIS — F17210 Nicotine dependence, cigarettes, uncomplicated: Secondary | ICD-10-CM | POA: Insufficient documentation

## 2022-05-05 DIAGNOSIS — M5416 Radiculopathy, lumbar region: Secondary | ICD-10-CM | POA: Insufficient documentation

## 2022-05-05 DIAGNOSIS — M47814 Spondylosis without myelopathy or radiculopathy, thoracic region: Secondary | ICD-10-CM | POA: Insufficient documentation

## 2022-05-05 DIAGNOSIS — Z0000 Encounter for general adult medical examination without abnormal findings: Secondary | ICD-10-CM | POA: Diagnosis not present

## 2022-05-09 ENCOUNTER — Telehealth: Payer: Self-pay | Admitting: Neurology

## 2022-05-09 ENCOUNTER — Encounter: Payer: Self-pay | Admitting: Neurology

## 2022-05-09 ENCOUNTER — Ambulatory Visit: Payer: No Typology Code available for payment source | Admitting: Neurology

## 2022-05-09 VITALS — BP 100/66 | HR 83 | Ht 69.0 in | Wt 233.5 lb

## 2022-05-09 DIAGNOSIS — F17219 Nicotine dependence, cigarettes, with unspecified nicotine-induced disorders: Secondary | ICD-10-CM

## 2022-05-09 DIAGNOSIS — Z118 Encounter for screening for other infectious and parasitic diseases: Secondary | ICD-10-CM | POA: Diagnosis not present

## 2022-05-09 DIAGNOSIS — Z0000 Encounter for general adult medical examination without abnormal findings: Secondary | ICD-10-CM | POA: Diagnosis not present

## 2022-05-09 DIAGNOSIS — H538 Other visual disturbances: Secondary | ICD-10-CM | POA: Diagnosis not present

## 2022-05-09 DIAGNOSIS — R519 Headache, unspecified: Secondary | ICD-10-CM

## 2022-05-09 DIAGNOSIS — G43709 Chronic migraine without aura, not intractable, without status migrainosus: Secondary | ICD-10-CM | POA: Diagnosis not present

## 2022-05-09 DIAGNOSIS — Z01419 Encounter for gynecological examination (general) (routine) without abnormal findings: Secondary | ICD-10-CM | POA: Diagnosis not present

## 2022-05-09 DIAGNOSIS — M47814 Spondylosis without myelopathy or radiculopathy, thoracic region: Secondary | ICD-10-CM

## 2022-05-09 DIAGNOSIS — M5416 Radiculopathy, lumbar region: Secondary | ICD-10-CM

## 2022-05-09 MED ORDER — AIMOVIG 70 MG/ML ~~LOC~~ SOAJ: 70.0000 mg | SUBCUTANEOUS | 11 refills | Status: DC

## 2022-05-09 MED ORDER — RIZATRIPTAN BENZOATE 10 MG PO TBDP: ORAL_TABLET | ORAL | 5 refills | Status: AC

## 2022-05-09 NOTE — Progress Notes (Unsigned)
Chief Complaint  Patient presents with   New Patient (Initial Visit)    Rm 14: alone Chronic headache (frequency 2-3 per week left temporal tender to the touch ongoing since September of 2023)      ASSESSMENT AND PLAN  Robin Weaver is a 47 y.o. female   Chronic migraine Rapid weight gain, blurry vision, right temporal pain,  Possible edge at bilateral funduscopy examination,  MRI of the brain without contrast  Ophthalmology evaluation to rule out intracranial hypertension  Continue Topamax 100 mg daily as preventive medications,  Add on aimovig as migraine prevention  Higher dose of Maxalt 10 mg as needed  Return To Clinic With NP In 6 Months     DIAGNOSTIC DATA (LABS, IMAGING, TESTING) - I reviewed patient records, labs, notes, testing and imaging myself where available.   MEDICAL HISTORY:  Robin Weaver, seen in request by   Serena Colonel, MD [address] [place],  [place] [address], Lewis Moccasin, MD   I reviewed and summarized the referring note. PMHX. Allergy. Depression, anxiety Interstitial cyclitis Chronic migraine POCS  Birth control, wrist from iv infusion,  Right Upper artery biopsy on January 05, 2022 no inflammation, no giant cells as seen  47 years old, she began to have migraine, whoel head hurt, hurt her eyes, naueous, votmitingg, noise, sensitvy, lying in dark, it happen2 times a month, can last 3-4 days,   TOpamax 2 years.  She is now have migriane about 2/month.  Maxlat does helpes   Right tenderness, could nto figuer out, she does nto have headahces, sharp, throbbing pain, all day long, that happen when ti ishapeend she feel dizziness, excruitating, cause her wince, it is dffernt, stabbing,  tryied Maxalt does nto help.  It can happey daiyly for 2 days,  it is different feelings.  She has dizziness spells, random, she has pain at that times,   Past Medical History:  Diagnosis Date   Anemia    Anxiety     Arthritis    upper spine   Asthma    Breast pain, left    left diffuse breast pain x 10/2015   Cervical cancer    Treated with TLH/BS, pathology from specimen only with CIN III   Chronic back pain    COVID    January 2023   Depression    Family history of adverse reaction to anesthesia    mother has trouble waking up and nausea   GERD (gastroesophageal reflux disease)    Migraine without aura    PCOS (polycystic ovarian syndrome)    PONV (postoperative nausea and vomiting)    TMJ disease    Tremor    left hand   Past Surgical History:  Procedure Laterality Date   ARTERY BIOPSY Right 01/05/2022   Procedure: RIGHT BIOPSY TEMPORAL ARTERY;  Surgeon: Serena Colonel, MD;  Location: [facility] OR;  Service: ENT;  Laterality: Right;   ROBOTIC ASSISTED TOTAL HYSTERECTOMY     With BS, had a ureteral injury, treated with a stent.    WRIST SURGERY       PHYSICAL EXAM:   Vitals:   05/09/22 0823  BP: 100/66  Pulse: 83  Weight: 233 lb 8 oz (105.9 kg)  Height:  (1.753 m)     Body mass index is 34.48 kg/m.  PHYSICAL EXAMNIATION:  Gen: NAD, conversant, well nourised, well groomed  Cardiovascular: Regular rate rhythm, no peripheral edema, warm, nontender. Eyes: Conjunctivae clear without exudates or hemorrhage Neck: Supple, no carotid bruits. Pulmonary: Clear to auscultation bilaterally   NEUROLOGICAL EXAM:  MENTAL STATUS: Speech/cognition: Awake, alert, oriented to history taking and casual conversation CRANIAL NERVES: CN II: Visual fields are full to confrontation. Pupils are round equal and briskly reactive to light.  Funduscopy examination showed blurry edge at bilateral optic disc CN III, IV, VI: extraocular movement are normal. No ptosis. CN V: Facial sensation is intact to light touch CN VII: Face is symmetric with normal eye closure  CN VIII: Hearing is normal to causal conversation. CN IX, X: Phonation is normal. CN XI: Head turning and shoulder  shrug are intact  MOTOR: There is no pronator drift of out-stretched arms. Muscle bulk and tone are normal. Muscle strength is normal.  REFLEXES: Reflexes are 2+ and symmetric at the biceps, triceps, knees, and ankles. Plantar responses are flexor.  SENSORY: Intact to light touch, pinprick and vibratory sensation are intact in fingers and toes.  COORDINATION: There is no trunk or limb dysmetria noted.  GAIT/STANCE: Posture is normal. Gait is steady    REVIEW OF SYSTEMS:  Full 14 system review of systems performed and notable only for as above All other review of systems were negative.   ALLERGIES: Allergies  Allergen Reactions   Hydrocodone-Acetaminophen Other (See Comments)    Other Reaction(s): GI Intolerance   Vicodin [Hydrocodone-Acetaminophen] Nausea And Vomiting    HOME MEDICATIONS: Current Outpatient Medications  Medication Sig Dispense Refill   albuterol (PROVENTIL HFA;VENTOLIN HFA) 108 (90 BASE) MCG/ACT inhaler Inhale 2 puffs into the lungs every 6 (six) hours as needed for wheezing or shortness of breath. For asthma     ALPRAZolam (XANAX) 0.5 MG tablet Take 0.5 mg by mouth 2 (two) times daily as needed for anxiety or sleep.     aspirin EC 81 MG tablet Take 81 mg by mouth at bedtime. Swallow whole.     budesonide-formoterol (SYMBICORT) 160-4.5 MCG/ACT inhaler Inhale 2 puffs into the lungs 2 (two) times daily.     cholecalciferol (VITAMIN D3) 25 MCG (1000 UNIT) tablet Take 1,000 Units by mouth daily.     EPINEPHrine 0.3 mg/0.3 mL IJ SOAJ injection Inject 0.3 mg into the muscle as needed for anaphylaxis. 1 each 0   escitalopram (LEXAPRO) 20 MG tablet Take 20 mg by mouth at bedtime.     ferrous sulfate 325 (65 FE) MG tablet Take 325 mg by mouth 2 (two) times daily with a meal.     meloxicam (MOBIC) 15 MG tablet Take 15 mg by mouth daily.     montelukast (SINGULAIR) 10 MG tablet Take 10 mg by mouth at bedtime.     omeprazole (PRILOSEC) 20 MG capsule Take 20 mg by mouth  at bedtime.     oxybutynin (DITROPAN-XL) 5 MG 24 hr tablet Take 5 mg by mouth at bedtime.     Prenatal Vit-Fe Fumarate-FA (PRENATAL VITAMINS) 28-0.8 MG TABS Take 1 tablet by mouth daily.     rizatriptan (MAXALT-MLT) 5 MG disintegrating tablet Take 5 mg by mouth as needed for migraine.     topiramate (TOPAMAX) 100 MG tablet Take 100 mg by mouth at bedtime.     No current facility-administered medications for this visit.    PAST MEDICAL HISTORY: Past Medical History:  Diagnosis Date   Anemia    Anxiety    Arthritis    upper spine   Asthma    Breast  pain, left    left diffuse breast pain x 10/2015   Cervical cancer    Treated with TLH/BS, pathology from specimen only with CIN III   Chronic back pain    COVID    January 2023   Depression    Family history of adverse reaction to anesthesia    mother has trouble waking up and nausea   GERD (gastroesophageal reflux disease)    Migraine without aura    PCOS (polycystic ovarian syndrome)    PONV (postoperative nausea and vomiting)    TMJ disease    Tremor    left hand    PAST SURGICAL HISTORY: Past Surgical History:  Procedure Laterality Date   ARTERY BIOPSY Right 01/05/2022   Procedure: RIGHT BIOPSY TEMPORAL ARTERY;  Surgeon: Serena Colonel, MD;  Location: [facility] OR;  Service: ENT;  Laterality: Right;   ROBOTIC ASSISTED TOTAL HYSTERECTOMY     With BS, had a ureteral injury, treated with a stent.    WRIST SURGERY      FAMILY HISTORY: Family History  Problem Relation Age of Onset   Hypertension Mother    Hypertension Father    Bladder Cancer Father    Colon cancer Maternal Grandmother     SOCIAL HISTORY: Social History   Socioeconomic History   Marital status: Significant Other    Spouse name: Not on file   Number of children: 1   Years of education: some college   Highest education level: Not on file  Occupational History   Occupation: Client care specialist  Tobacco Use   Smoking status: Former    Packs/day: 0.50     Years: 12.00    Additional pack years: 0.00    Total pack years: 6.00    Types: Cigarettes    Quit date: 11/2015    Years since quitting: 6.4   Smokeless tobacco: Never  Vaping Use   Vaping Use: Never used  Substance and Sexual Activity   Alcohol use: Yes    Alcohol/week: 1.0 standard drink of alcohol    Types: 1 Cans of beer per week    Comment: occasional   Drug use: No   Sexual activity: Yes    Birth control/protection: Surgical    Comment: Hysterectomy  Other Topics Concern   Not on file  Social History Narrative   Lives at home with significant other.   Right-handed.   Caffeine use: occasional   Social Determinants of Corporate investment banker Strain: Not on file  Food Insecurity: Not on file  Transportation Needs: Not on file  Physical Activity: Not on file  Stress: Not on file  Social Connections: Not on file  Intimate Partner Violence: Not on file      Levert Feinstein, M.D. Ph.D.  [facility] [address], [address] [place], [place] [address] Ph: [phone] Fax: [phone]  CC:  Serena Colonel, MD [address] [place],  [place] [address]  Lewis Moccasin, MD

## 2022-05-09 NOTE — Telephone Encounter (Signed)
[employer] sent to [facility] they obtain auth [id number] 

## 2022-05-24 ENCOUNTER — Telehealth: Payer: Self-pay | Admitting: Neurology

## 2022-05-24 ENCOUNTER — Ambulatory Visit
Admission: RE | Admit: 2022-05-24 | Discharge: 2022-05-24 | Disposition: A | Discharge status: Home / Self Care | Payer: No Typology Code available for payment source | Source: Ambulatory Visit | Attending: Family Medicine | Admitting: Family Medicine

## 2022-05-24 ENCOUNTER — Encounter: Payer: Self-pay | Admitting: Hematology and Oncology

## 2022-05-24 ENCOUNTER — Other Ambulatory Visit: Payer: Self-pay

## 2022-05-24 ENCOUNTER — Other Ambulatory Visit (HOSPITAL_COMMUNITY): Payer: Self-pay

## 2022-05-24 ENCOUNTER — Inpatient Hospital Stay: Payer: No Typology Code available for payment source | Attending: Hematology and Oncology

## 2022-05-24 DIAGNOSIS — Z1231 Encounter for screening mammogram for malignant neoplasm of breast: Secondary | ICD-10-CM

## 2022-05-24 DIAGNOSIS — J45998 Other asthma: Secondary | ICD-10-CM | POA: Diagnosis not present

## 2022-05-24 DIAGNOSIS — F419 Anxiety disorder, unspecified: Secondary | ICD-10-CM | POA: Diagnosis not present

## 2022-05-24 DIAGNOSIS — E611 Iron deficiency: Secondary | ICD-10-CM | POA: Insufficient documentation

## 2022-05-24 DIAGNOSIS — D509 Iron deficiency anemia, unspecified: Secondary | ICD-10-CM | POA: Diagnosis not present

## 2022-05-24 DIAGNOSIS — Z1211 Encounter for screening for malignant neoplasm of colon: Secondary | ICD-10-CM | POA: Diagnosis not present

## 2022-05-24 DIAGNOSIS — F32A Depression, unspecified: Secondary | ICD-10-CM | POA: Diagnosis not present

## 2022-05-24 LAB — IRON AND IRON BINDING CAPACITY (CC-WL,HP ONLY)
Iron: 39 ug/dL (ref 28–170)
Saturation Ratios: 10 % — ABNORMAL LOW (ref 10.4–31.8)
TIBC: 395 ug/dL (ref 250–450)
UIBC: 356 ug/dL

## 2022-05-24 LAB — CBC WITH DIFFERENTIAL (CANCER CENTER ONLY)
Abs Immature Granulocytes: 0.02 10*3/uL (ref 0.00–0.07)
Basophils Absolute: 0.1 10*3/uL (ref 0.0–0.1)
Basophils Relative: 1 %
Eosinophils Absolute: 0.3 10*3/uL (ref 0.0–0.5)
Eosinophils Relative: 4 %
HCT: 40.5 % (ref 36.0–46.0)
Hemoglobin: 13.3 g/dL (ref 12.0–15.0)
Immature Granulocytes: 0 %
Lymphocytes Relative: 27 %
Lymphs Abs: 1.9 10*3/uL (ref 0.7–4.0)
MCH: 25.6 pg — ABNORMAL LOW (ref 26.0–34.0)
MCHC: 32.8 g/dL (ref 30.0–36.0)
MCV: 78 fL — ABNORMAL LOW (ref 80.0–100.0)
Monocytes Absolute: 0.4 10*3/uL (ref 0.1–1.0)
Monocytes Relative: 6 %
Neutro Abs: 4.5 10*3/uL (ref 1.7–7.7)
Neutrophils Relative %: 62 %
Platelet Count: 305 10*3/uL (ref 150–400)
RBC: 5.19 MIL/uL — ABNORMAL HIGH (ref 3.87–5.11)
RDW: 15.3 % (ref 11.5–15.5)
WBC Count: 7.1 10*3/uL (ref 4.0–10.5)
nRBC: 0 % (ref 0.0–0.2)

## 2022-05-24 LAB — RETICULOCYTES
Immature Retic Fract: 10 % (ref 2.3–15.9)
RBC.: 5.16 MIL/uL — ABNORMAL HIGH (ref 3.87–5.11)
Retic Count, Absolute: 81 10*3/uL (ref 19.0–186.0)
Retic Ct Pct: 1.6 % (ref 0.4–3.1)

## 2022-05-24 LAB — FERRITIN: Ferritin: 10 ng/mL — ABNORMAL LOW (ref 11–307)

## 2022-05-24 NOTE — Telephone Encounter (Signed)
Pt stated her insurance will not pay for Erenumab-aooe (AIMOVIG) 70 MG/ML SOAJ. Pt wants to know if there is something else Dr. Terrace Arabia can prescript .

## 2022-05-25 MED ORDER — EMGALITY 120 MG/ML ~~LOC~~ SOAJ: 120.0000 mg | Freq: Every evening | SUBCUTANEOUS | 12 refills | Status: AC

## 2022-05-25 NOTE — Telephone Encounter (Signed)
Called pt LVM to please call office. 

## 2022-05-25 NOTE — Addendum Note (Signed)
Addended by: Levert Feinstein on: 05/25/2022 10:18 AM   Modules accepted: Orders

## 2022-05-25 NOTE — Telephone Encounter (Signed)
Meds ordered this encounter  Medications   Galcanezumab-gnlm (EMGALITY) 120 MG/ML SOAJ    Sig: Inject 120 mg into the skin Nightly. 120mg  SQ x2 consecutive as loading dose    Dispense:  1.12 mL    Refill:  12

## 2022-05-25 NOTE — Telephone Encounter (Signed)
Pt called back. Informed pt of message nurse Katie sent , Pt stated she will check with the pharmacy and call the office if she needs PA.

## 2022-05-26 ENCOUNTER — Inpatient Hospital Stay: Payer: No Typology Code available for payment source | Admitting: Hematology and Oncology

## 2022-05-26 ENCOUNTER — Encounter: Payer: Self-pay | Admitting: Hematology and Oncology

## 2022-05-26 DIAGNOSIS — E282 Polycystic ovarian syndrome: Secondary | ICD-10-CM | POA: Diagnosis not present

## 2022-05-26 DIAGNOSIS — E611 Iron deficiency: Secondary | ICD-10-CM

## 2022-05-26 DIAGNOSIS — Z713 Dietary counseling and surveillance: Secondary | ICD-10-CM | POA: Diagnosis not present

## 2022-05-26 NOTE — Progress Notes (Signed)
HEMATOLOGY-ONCOLOGY ELECTRONIC VISIT PROGRESS NOTE  Patient Care Team: Lewis Moccasin, MD as PCP - General (Family Medicine) Cindee Salt, MD as Consulting Physician (Orthopedic Surgery)  I connected with the patient via telephone conference and verified that I am speaking with the correct person using two identifiers. The patient's location is at home and I am providing care from the [facility] I discussed the limitations, risks, security and privacy concerns of performing an evaluation and management service by e-visits and the availability of in person appointments.  I also discussed with the patient that there may be a patient responsible charge related to this service. The patient expressed understanding and agreed to proceed.   ASSESSMENT & PLAN:  Iron deficiency She has mild persistent iron deficiency but not anemic She tolerated intravenous iron sucrose poorly complicated by posttreatment phlebitis For now, I am not in favor of prescribing any more intravenous iron infusion I recommend the patient to resume oral iron supplement as tolerated She has frequent follow-up and monitoring of blood count from her primary care doctor and encouraged her to continue to get her blood count checked by primary care doctor She is welcome to call me or send me a MyChart messages to update me with her blood test results We will bring her back in treat with different brand of intravenous iron if needed but only in the setting of anemia, not just iron deficiency She expressed verbal understanding  No orders of the defined types were placed in this encounter.   INTERVAL HISTORY: Please see below for problem oriented charting. The purpose of today's discussion is to review test results and discussed plan of care Her treatment with intravenous iron sucrose was complicated by post treatment phlebitis This has resolved Her energy level is fair We reviewed test results  HISTORY OF PRESENTING  ILLNESS:  Robin Weaver 47 y.o. female is here because of anemia  She was found to have abnormal CBC from recent blood work Technically, she is not anemic but have microcytosis I have the opportunity to review her baseline CBC Summary of her blood count is as follows January 22, 2008, hemoglobin 13.4 October 15, 2008, hemoglobin 14 December 11, 2011, hemoglobin 15.1 October 28, 2015, hemoglobin 14.7 February 28, 2019, hemoglobin 13.9 January 05, 2022, hemoglobin 12.8, MCV 73.8, iron 39 and ferritin 4.7  She denies recent chest pain on exertion, shortness of breath on minimal exertion, pre-syncopal episodes, or palpitations. However, she complained of excessive fatigue despite sleeping for many hours a day She have difficulties functioning on a day-to-day basis She had not noticed any recent bleeding such as epistaxis, hematuria or hematochezia The patient denies over the counter NSAID ingestion. She is taking Mobic long-term for chronic joint pain.  She is also prescribed 81 mg aspirin due to severe migraine headache and also evaluation to rule out temporal arteritis. Her last colonoscopy was several years ago.  She denies abnormal report from colonoscopy  She has remote history of abnormal Pap smear.  In December 2010, she underwent hysterectomy.  Pathology did not show invasive cervical cancer and she have normal evaluation since then Her age appropriate screening programs are up-to-date. She denies any pica and eats a variety of diet. She never donated blood or received blood transfusion The patient was prescribed oral iron supplements and she takes twice a day for several months without significant improvement of her blood count In February 2024, she received 1 dose of intravenous iron sucrose complicated by posttreatment phlebitis  REVIEW OF SYSTEMS:   Constitutional: Denies fevers, chills or abnormal weight loss Eyes: Denies blurriness of vision Ears, nose, mouth, throat,  and face: Denies mucositis or sore throat Respiratory: Denies cough, dyspnea or wheezes Cardiovascular: Denies palpitation, chest discomfort Gastrointestinal:  Denies nausea, heartburn or change in bowel habits Skin: Denies abnormal skin rashes Lymphatics: Denies new lymphadenopathy or easy bruising Neurological:Denies numbness, tingling or new weaknesses Behavioral/Psych: Mood is stable, no new changes  Extremities: No lower extremity edema All other systems were reviewed with the patient and are negative.  I have reviewed the past medical history, past surgical history, social history and family history with the patient and they are unchanged from previous note.  ALLERGIES:  is allergic to hydrocodone-acetaminophen and vicodin [hydrocodone-acetaminophen].  MEDICATIONS:  Current Outpatient Medications  Medication Sig Dispense Refill   albuterol (PROVENTIL HFA;VENTOLIN HFA) 108 (90 BASE) MCG/ACT inhaler Inhale 2 puffs into the lungs every 6 (six) hours as needed for wheezing or shortness of breath. For asthma     ALPRAZolam (XANAX) 0.5 MG tablet Take 0.5 mg by mouth 2 (two) times daily as needed for anxiety or sleep.     budesonide-formoterol (SYMBICORT) 160-4.5 MCG/ACT inhaler Inhale 2 puffs into the lungs 2 (two) times daily.     cholecalciferol (VITAMIN D3) 25 MCG (1000 UNIT) tablet Take 1,000 Units by mouth daily.     EPINEPHrine 0.3 mg/0.3 mL IJ SOAJ injection Inject 0.3 mg into the muscle as needed for anaphylaxis. 1 each 0   escitalopram (LEXAPRO) 20 MG tablet Take 20 mg by mouth at bedtime.     Galcanezumab-gnlm (EMGALITY) 120 MG/ML SOAJ Inject 120 mg into the skin Nightly. 120mg  SQ x2 consecutive as loading dose 1.12 mL 12   meloxicam (MOBIC) 15 MG tablet Take 15 mg by mouth daily.     montelukast (SINGULAIR) 10 MG tablet Take 10 mg by mouth at bedtime.     oxybutynin (DITROPAN-XL) 5 MG 24 hr tablet Take 5 mg by mouth at bedtime.     Prenatal Vit-Fe Fumarate-FA (PRENATAL  VITAMINS) 28-0.8 MG TABS Take 1 tablet by mouth daily.     rizatriptan (MAXALT-MLT) 10 MG disintegrating tablet Take 1 tab at onset of migraine.  May repeat in 2 hrs, if needed.  Max dose: 2 tabs/day. This is a 30 day prescription. 10 tablet 5   topiramate (TOPAMAX) 100 MG tablet Take 100 mg by mouth at bedtime.     No current facility-administered medications for this visit.    PHYSICAL EXAMINATION: ECOG PERFORMANCE STATUS: 0 - Asymptomatic  LABORATORY DATA:  I have reviewed the data as listed    Latest Ref Rng & Units 02/28/2019    8:38 AM 10/28/2015    9:30 AM 12/11/2011   10:35 AM  CMP  Glucose 65 - 99 mg/dL 98  161  90   BUN 6 - 24 mg/dL 16  13  12    Creatinine 0.57 - 1.00 mg/dL 0.96  0.45  4.09   Sodium 134 - 144 mmol/L 141  138  135   Potassium 3.5 - 5.2 mmol/L 4.7  4.1  4.2   Chloride 96 - 106 mmol/L 107  106  99   CO2 20 - 29 mmol/L 18  24  27    Calcium 8.7 - 10.2 mg/dL 9.1  9.0  9.7   Total Protein 6.0 - 8.5 g/dL 6.8   7.5   Total Bilirubin 0.0 - 1.2 mg/dL <8.1   0.3   Alkaline Phos 39 -  117 IU/L 84   94   AST 0 - 40 IU/L 9   16   ALT 0 - 32 IU/L 10   18     Lab Results  Component Value Date   WBC 7.1 05/24/2022   HGB 13.3 05/24/2022   HCT 40.5 05/24/2022   MCV 78.0 (L) 05/24/2022   PLT 305 05/24/2022   NEUTROABS 4.5 05/24/2022     RADIOGRAPHIC STUDIES: I have personally reviewed the radiological images as listed and agreed with the findings in the report. MM 3D SCREENING MAMMOGRAM BILATERAL BREAST  Result Date: 05/25/2022 CLINICAL DATA:  Screening. EXAM: DIGITAL SCREENING BILATERAL MAMMOGRAM WITH TOMOSYNTHESIS AND CAD TECHNIQUE: Bilateral screening digital craniocaudal and mediolateral oblique mammograms were obtained. Bilateral screening digital breast tomosynthesis was performed. The images were evaluated with computer-aided detection. COMPARISON:  Previous exam(s). ACR Breast Density Category d: The breasts are extremely dense, which lowers the sensitivity  of mammography. FINDINGS: There are no findings suspicious for malignancy. IMPRESSION: No mammographic evidence of malignancy. A result letter of this screening mammogram will be mailed directly to the patient. RECOMMENDATION: Screening mammogram in one year. (Code:SM-B-01Y) BI-RADS CATEGORY  1: Negative. Electronically Signed   By: Harmon Pier M.D.   On: 05/25/2022 12:49    I discussed the assessment and treatment plan with the patient. The patient was provided an opportunity to ask questions and all were answered. The patient agreed with the plan and demonstrated an understanding of the instructions. The patient was advised to call back or seek an in-person evaluation if the symptoms worsen or if the condition fails to improve as anticipated.    I spent 20 minutes for the appointment reviewing test results, discuss management and coordination of care.  Artis Delay, MD 05/26/2022 9:48 AM

## 2022-05-26 NOTE — Assessment & Plan Note (Signed)
She has mild persistent iron deficiency but not anemic She tolerated intravenous iron sucrose poorly complicated by posttreatment phlebitis For now, I am not in favor of prescribing any more intravenous iron infusion I recommend the patient to resume oral iron supplement as tolerated She has frequent follow-up and monitoring of blood count from her primary care doctor and encouraged her to continue to get her blood count checked by primary care doctor She is welcome to call me or send me a MyChart messages to update me with her blood test results We will bring her back in treat with different brand of intravenous iron if needed but only in the setting of anemia, not just iron deficiency She expressed verbal understanding

## 2022-06-02 ENCOUNTER — Ambulatory Visit
Admission: RE | Admit: 2022-06-02 | Discharge: 2022-06-02 | Disposition: A | Discharge status: Home / Self Care | Payer: No Typology Code available for payment source | Source: Ambulatory Visit | Attending: Neurology | Admitting: Neurology

## 2022-06-02 DIAGNOSIS — R519 Headache, unspecified: Secondary | ICD-10-CM | POA: Diagnosis not present

## 2022-06-02 DIAGNOSIS — G43709 Chronic migraine without aura, not intractable, without status migrainosus: Secondary | ICD-10-CM

## 2022-06-02 DIAGNOSIS — H538 Other visual disturbances: Secondary | ICD-10-CM | POA: Diagnosis not present

## 2022-06-02 DIAGNOSIS — D509 Iron deficiency anemia, unspecified: Secondary | ICD-10-CM | POA: Diagnosis not present

## 2022-06-07 DIAGNOSIS — H538 Other visual disturbances: Secondary | ICD-10-CM | POA: Diagnosis not present

## 2022-06-07 DIAGNOSIS — G43709 Chronic migraine without aura, not intractable, without status migrainosus: Secondary | ICD-10-CM | POA: Diagnosis not present

## 2022-06-07 DIAGNOSIS — R519 Headache, unspecified: Secondary | ICD-10-CM | POA: Diagnosis not present

## 2022-06-23 DIAGNOSIS — E611 Iron deficiency: Secondary | ICD-10-CM | POA: Diagnosis not present

## 2022-06-24 DIAGNOSIS — K644 Residual hemorrhoidal skin tags: Secondary | ICD-10-CM | POA: Diagnosis not present

## 2022-06-24 DIAGNOSIS — K2960 Other gastritis without bleeding: Secondary | ICD-10-CM | POA: Diagnosis not present

## 2022-06-24 DIAGNOSIS — K319 Disease of stomach and duodenum, unspecified: Secondary | ICD-10-CM | POA: Diagnosis not present

## 2022-06-24 DIAGNOSIS — K31A11 Gastric intestinal metaplasia without dysplasia, involving the antrum: Secondary | ICD-10-CM | POA: Diagnosis not present

## 2022-06-24 DIAGNOSIS — K648 Other hemorrhoids: Secondary | ICD-10-CM | POA: Diagnosis not present

## 2022-06-24 DIAGNOSIS — D509 Iron deficiency anemia, unspecified: Secondary | ICD-10-CM | POA: Diagnosis not present

## 2022-06-24 DIAGNOSIS — K3189 Other diseases of stomach and duodenum: Secondary | ICD-10-CM | POA: Diagnosis not present

## 2022-06-24 DIAGNOSIS — Z1211 Encounter for screening for malignant neoplasm of colon: Secondary | ICD-10-CM | POA: Diagnosis not present

## 2022-07-28 ENCOUNTER — Other Ambulatory Visit: Payer: Self-pay | Admitting: Gastroenterology

## 2022-07-28 DIAGNOSIS — D509 Iron deficiency anemia, unspecified: Secondary | ICD-10-CM | POA: Diagnosis not present

## 2022-07-28 DIAGNOSIS — J4540 Moderate persistent asthma, uncomplicated: Secondary | ICD-10-CM | POA: Diagnosis not present

## 2022-07-28 DIAGNOSIS — K219 Gastro-esophageal reflux disease without esophagitis: Secondary | ICD-10-CM | POA: Diagnosis not present

## 2022-07-28 DIAGNOSIS — N3010 Interstitial cystitis (chronic) without hematuria: Secondary | ICD-10-CM | POA: Diagnosis not present

## 2022-07-28 DIAGNOSIS — G8929 Other chronic pain: Secondary | ICD-10-CM | POA: Diagnosis not present

## 2022-07-28 DIAGNOSIS — E282 Polycystic ovarian syndrome: Secondary | ICD-10-CM | POA: Diagnosis not present

## 2022-07-28 DIAGNOSIS — F411 Generalized anxiety disorder: Secondary | ICD-10-CM | POA: Diagnosis not present

## 2022-07-28 DIAGNOSIS — M546 Pain in thoracic spine: Secondary | ICD-10-CM | POA: Diagnosis not present

## 2022-08-08 ENCOUNTER — Encounter (HOSPITAL_COMMUNITY)
Admission: RE | Disposition: A | Discharge status: Home / Self Care | Payer: Self-pay | Source: Home / Self Care | Attending: Gastroenterology

## 2022-08-08 ENCOUNTER — Ambulatory Visit (HOSPITAL_COMMUNITY)
Admission: RE | Admit: 2022-08-08 | Discharge: 2022-08-08 | Disposition: A | Discharge status: Home / Self Care | Payer: 59 | Attending: Gastroenterology | Admitting: Gastroenterology

## 2022-08-08 DIAGNOSIS — D509 Iron deficiency anemia, unspecified: Secondary | ICD-10-CM | POA: Insufficient documentation

## 2022-08-08 HISTORY — PX: GIVENS CAPSULE STUDY: SHX5432

## 2022-08-08 SURGERY — IMAGING PROCEDURE, GI TRACT, INTRALUMINAL, VIA CAPSULE

## 2022-08-08 SURGICAL SUPPLY — 1 items: TOWEL COTTON PACK 4EA (MISCELLANEOUS) ×2 IMPLANT

## 2022-08-08 NOTE — Progress Notes (Signed)
[facility] capsule endoscopy ordered by MD [facility].  Patient ingested capsule at 0726.  Per Given's capsule instructions, patient to remain NPO until 0926 at which time they may progress to clear liquid diet. At 1126 patient may have a small snack such as a half a sandwich or a bowl of soup. At 1526 patient may progress to previously ordered diet.  The capsule endoscopy study will conclude at 1526 at which time the recorder and leads or belt can be removed and placed in a patient belongings bag. Pt will return equipment at the end of the day.

## 2022-08-09 ENCOUNTER — Encounter (HOSPITAL_COMMUNITY): Payer: Self-pay | Admitting: Gastroenterology

## 2022-08-10 DIAGNOSIS — D509 Iron deficiency anemia, unspecified: Secondary | ICD-10-CM | POA: Diagnosis not present

## 2022-08-23 ENCOUNTER — Emergency Department (HOSPITAL_BASED_OUTPATIENT_CLINIC_OR_DEPARTMENT_OTHER): Payer: 59

## 2022-08-23 ENCOUNTER — Other Ambulatory Visit: Payer: Self-pay

## 2022-08-23 ENCOUNTER — Emergency Department (HOSPITAL_BASED_OUTPATIENT_CLINIC_OR_DEPARTMENT_OTHER)
Admission: EM | Admit: 2022-08-23 | Discharge: 2022-08-23 | Disposition: A | Discharge status: Home / Self Care | Payer: 59 | Attending: Emergency Medicine | Admitting: Emergency Medicine

## 2022-08-23 ENCOUNTER — Encounter (HOSPITAL_BASED_OUTPATIENT_CLINIC_OR_DEPARTMENT_OTHER): Payer: Self-pay

## 2022-08-23 DIAGNOSIS — G43809 Other migraine, not intractable, without status migrainosus: Secondary | ICD-10-CM | POA: Diagnosis not present

## 2022-08-23 DIAGNOSIS — Z1152 Encounter for screening for COVID-19: Secondary | ICD-10-CM | POA: Diagnosis not present

## 2022-08-23 DIAGNOSIS — R519 Headache, unspecified: Secondary | ICD-10-CM | POA: Diagnosis not present

## 2022-08-23 LAB — SARS CORONAVIRUS 2 BY RT PCR: SARS Coronavirus 2 by RT PCR: NEGATIVE

## 2022-08-23 LAB — CBC WITH DIFFERENTIAL/PLATELET
Abs Immature Granulocytes: 0.01 10*3/uL (ref 0.00–0.07)
Basophils Absolute: 0.1 10*3/uL (ref 0.0–0.1)
Basophils Relative: 1 %
Eosinophils Absolute: 0.3 10*3/uL (ref 0.0–0.5)
Eosinophils Relative: 4 %
HCT: 42.9 % (ref 36.0–46.0)
Hemoglobin: 14.2 g/dL (ref 12.0–15.0)
Immature Granulocytes: 0 %
Lymphocytes Relative: 25 %
Lymphs Abs: 1.8 10*3/uL (ref 0.7–4.0)
MCH: 26 pg (ref 26.0–34.0)
MCHC: 33.1 g/dL (ref 30.0–36.0)
MCV: 78.4 fL — ABNORMAL LOW (ref 80.0–100.0)
Monocytes Absolute: 0.4 10*3/uL (ref 0.1–1.0)
Monocytes Relative: 6 %
Neutro Abs: 4.6 10*3/uL (ref 1.7–7.7)
Neutrophils Relative %: 64 %
Platelets: 338 10*3/uL (ref 150–400)
RBC: 5.47 MIL/uL — ABNORMAL HIGH (ref 3.87–5.11)
RDW: 14.1 % (ref 11.5–15.5)
WBC: 7.2 10*3/uL (ref 4.0–10.5)
nRBC: 0 % (ref 0.0–0.2)

## 2022-08-23 LAB — COMPREHENSIVE METABOLIC PANEL
ALT: 10 U/L (ref 0–44)
AST: 11 U/L — ABNORMAL LOW (ref 15–41)
Albumin: 4 g/dL (ref 3.5–5.0)
Alkaline Phosphatase: 53 U/L (ref 38–126)
Anion gap: 12 (ref 5–15)
BUN: 11 mg/dL (ref 6–20)
CO2: 20 mmol/L — ABNORMAL LOW (ref 22–32)
Calcium: 8.9 mg/dL (ref 8.9–10.3)
Chloride: 105 mmol/L (ref 98–111)
Creatinine, Ser: 0.87 mg/dL (ref 0.44–1.00)
GFR, Estimated: >60 mL/min (ref >60)
Glucose, Bld: 97 mg/dL (ref 70–99)
Potassium: 4 mmol/L (ref 3.5–5.1)
Sodium: 137 mmol/L (ref 135–145)
Total Bilirubin: 0.3 mg/dL (ref 0.3–1.2)
Total Protein: 6.9 g/dL (ref 6.5–8.1)

## 2022-08-23 MED ORDER — DIPHENHYDRAMINE HCL 50 MG/ML IJ SOLN
25.0000 mg | Freq: Once | INTRAMUSCULAR | Status: AC
  Administered 2022-08-23: 25 mg via INTRAVENOUS
  Filled 2022-08-23: qty 1

## 2022-08-23 MED ORDER — PROCHLORPERAZINE EDISYLATE 10 MG/2ML IJ SOLN
10.0000 mg | Freq: Once | INTRAMUSCULAR | Status: AC
  Administered 2022-08-23: 10 mg via INTRAVENOUS
  Filled 2022-08-23: qty 2

## 2022-08-23 MED ORDER — SODIUM CHLORIDE 0.9 % IV BOLUS
1000.0000 mL | Freq: Once | INTRAVENOUS | Status: AC
  Administered 2022-08-23: 1000 mL via INTRAVENOUS

## 2022-08-23 MED ORDER — KETOROLAC TROMETHAMINE 15 MG/ML IJ SOLN
15.0000 mg | Freq: Once | INTRAMUSCULAR | Status: AC
  Administered 2022-08-23: 15 mg via INTRAVENOUS
  Filled 2022-08-23: qty 1

## 2022-08-23 NOTE — ED Triage Notes (Signed)
Pt c/o migraine, hx of same. States that she usually takes topamax, rizatriptan, emgality. 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

## 2022-08-23 NOTE — ED Notes (Signed)
Patient verbalizes understanding of discharge instructions. Opportunity for questioning and answers were provided. Patient discharged from ED. VSS. IV removed x1

## 2022-08-23 NOTE — ED Provider Notes (Signed)
[facility] EMERGENCY DEPARTMENT AT [facility] Provider Note   CSN: [id number] Arrival date & time: 08/23/22  1115     History Chief Complaint  Patient presents with   Migraine    Robin Weaver is a 47 y.o. female.  Patient with past history significant for migraine headaches who presents to the ED with a headache. States this has been ongoing for the last 7 days without improvement. Has tried taking her Rizatriptan without relief. Also takes Emgality once monthly. Denies any unilateral weakness or numbness, decreased strength, vomiting, fever, diarrhea, or vision changes. 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 Likely benign finding based on MRI result, but patient planning on following up with [facility] for second opinion.   Migraine Associated symptoms include headaches.       Home Medications Prior to Admission medications   Medication Sig Start Date End Date Taking? Authorizing Provider  WIXELA INHUB 250-50 MCG/ACT AEPB Inhale 1 puff into the lungs 2 (two) times daily. 08/09/22  Yes [provider]  albuterol (PROVENTIL HFA;VENTOLIN HFA) 108 (90 BASE) MCG/ACT inhaler Inhale 2 puffs into the lungs every 6 (six) hours as needed for wheezing or shortness of breath. For asthma    [provider]  ALPRAZolam Prudy Feeler) 0.5 MG tablet Take 0.5 mg by mouth 2 (two) times daily as needed for anxiety. 05/11/09   [provider]  budesonide-formoterol (SYMBICORT) 160-4.5 MCG/ACT inhaler Inhale 2 puffs into the lungs 2 (two) times daily.    [provider]  Cholecalciferol (VITAMIN D) 125 MCG (5000 UT) CAPS Take 5,000 Units by mouth daily.    [provider]  EMGALITY 120 MG/ML SOAJ Inject 120 mg into the skin every 30 (thirty) days. 07/22/22   [provider]  EPINEPHrine 0.3 mg/0.3 mL IJ SOAJ injection Inject 0.3 mg into the muscle as needed for anaphylaxis. 05/08/21    Tilden Fossa, MD  escitalopram (LEXAPRO) 20 MG tablet Take 20 mg by mouth at bedtime. 10/27/20   [provider]  ferrous sulfate 324 MG TBEC Take 324 mg by mouth every Monday, Wednesday, and Friday.    [provider]  LORYNA 3-0.02 MG tablet Take 1 tablet by mouth daily. 07/28/22   [provider]  meloxicam (MOBIC) 7.5 MG tablet Take 7.5 mg by mouth daily as needed for pain.    [provider]  montelukast (SINGULAIR) 10 MG tablet Take 10 mg by mouth at bedtime.    [provider]  omeprazole (PRILOSEC) 20 MG capsule Take 20 mg by mouth daily as needed. 07/28/22   [provider]  oxybutynin (DITROPAN-XL) 5 MG 24 hr tablet Take 5 mg by mouth at bedtime. 01/19/21   [provider]  Prenatal Vit-Fe Fumarate-FA (PRENATAL VITAMINS) 28-0.8 MG TABS Take 1 tablet by mouth daily. 07/29/21   [provider]  rizatriptan (MAXALT-MLT) 10 MG disintegrating tablet Take 1 tab at onset of migraine.  May repeat in 2 hrs, if needed.  Max dose: 2 tabs/day. This is a 30 day prescription. 05/09/22   Levert Feinstein, MD  topiramate (TOPAMAX) 100 MG tablet Take 100 mg by mouth at bedtime.    [provider]  WEGOVY 1 MG/0.5ML SOAJ Inject 1 mg into the skin once a week. 07/28/22   [provider]      Allergies    Hydrocodone-acetaminophen and Vicodin [hydrocodone-acetaminophen]    Review of Systems  Review of Systems  Neurological:  Positive for headaches.  All other systems reviewed and are negative.   Physical Exam Updated Vital Signs BP 128/81   Pulse 77   Temp 98.2 F (36.8 C)   Resp 16   SpO2 100%  Physical Exam Vitals and nursing note reviewed.  Constitutional:      General: She is not in acute distress.    Appearance: She is well-developed.  HENT:     Head: Normocephalic and atraumatic.  Eyes:     Extraocular Movements: Extraocular movements intact.     Conjunctiva/sclera: Conjunctivae normal.     Pupils:  Pupils are equal, round, and reactive to light.  Cardiovascular:     Rate and Rhythm: Normal rate and regular rhythm.     Heart sounds: No murmur heard. Pulmonary:     Effort: Pulmonary effort is normal. No respiratory distress.     Breath sounds: Normal breath sounds.  Abdominal:     Palpations: Abdomen is soft.     Tenderness: There is no abdominal tenderness.  Musculoskeletal:        General: No swelling.     Cervical back: Neck supple.  Skin:    General: Skin is warm and dry.     Capillary Refill: Capillary refill takes less than 2 seconds.  Neurological:     General: No focal deficit present.     Mental Status: She is alert and oriented to person, place, and time. Mental status is at baseline.     Motor: No weakness.  Psychiatric:        Mood and Affect: Mood normal.     ED Results / Procedures / Treatments   Labs (all labs ordered are listed, but only abnormal results are displayed) Labs Reviewed  CBC WITH DIFFERENTIAL/PLATELET - Abnormal; Notable for the following components:      Result Value   RBC 5.47 (*)    MCV 78.4 (*)    All other components within normal limits  COMPREHENSIVE METABOLIC PANEL - Abnormal; Notable for the following components:   CO2 20 (*)    AST 11 (*)    All other components within normal limits  SARS CORONAVIRUS 2 BY RT PCR    EKG None  Radiology CT Head Wo Contrast  Result Date: 08/23/2022 CLINICAL DATA:  47 year old female with unrelenting headache since last week. Nausea and photosensitivity. EXAM: CT HEAD WITHOUT CONTRAST TECHNIQUE: Contiguous axial images were obtained from the base of the skull through the vertex without intravenous contrast. RADIATION DOSE REDUCTION: This exam was performed according to the departmental dose-optimization program which includes automated exposure control, adjustment of the mA and/or kV according to patient size and/or use of iterative reconstruction technique. COMPARISON:  Brain MRI 06/02/2022.  FINDINGS: Brain: Cerebral volume is within normal limits. No midline shift, ventriculomegaly, mass effect, evidence of mass lesion, intracranial hemorrhage or evidence of cortically based acute infarction. Gray-white matter differentiation is within normal limits throughout the brain. Vascular: No suspicious intracranial vascular hyperdensity. Skull: Negative. Sinuses/Orbits: Mild ethmoid and maxillary sinus mucosal thickening appears stable since May. Frontal and sphenoid sinuses, bilateral tympanic cavities and mastoids appear clear. Other: Visualized orbits and scalp soft tissues are within normal limits. IMPRESSION: 1. Normal noncontrast CT appearance of the brain. 2. Mild ethmoid and maxillary sinus inflammation, stable since May and significance doubtful. Electronically Signed   By: Odessa Fleming M.D.   On: 08/23/2022 11:55    Procedures Procedures   Medications Ordered in ED Medications  sodium chloride 0.9 % bolus 1,000 mL (0 mLs Intravenous Stopped 08/23/22 1320)  prochlorperazine (COMPAZINE) injection 10 mg (10 mg Intravenous Given 08/23/22 1211)  diphenhydrAMINE (BENADRYL) injection 25 mg (25 mg Intravenous Given 08/23/22 1211)  ketorolac (TORADOL) 15 MG/ML injection 15 mg (15 mg Intravenous Given 08/23/22 1317)    ED Course/ Medical Decision Making/ A&P                               Medical Decision Making Amount and/or Complexity of Data Reviewed Labs: ordered. Radiology: ordered.  Risk Prescription drug management.   This patient presents to the ED for concern of migraine.  Differential diagnosis includes tension headache, viral URI, concussion, SAH, complex migraine   Lab Tests:  I Ordered, and personally interpreted labs.  The pertinent results include: CBC unremarkable, CMP unremarkable, COVID-19 negative   Imaging Studies ordered:  I ordered imaging studies including CT head I independently visualized and interpreted imaging which showed no acute intracranial normality I  agree with the radiologist interpretation   Medicines ordered and prescription drug management:  I ordered medication including fluids, Compazine, Benadryl, Toradol for migraine cocktail Reevaluation of the patient after these medicines showed that the patient improved I have reviewed the patients home medicines and have made adjustments as needed   Problem List / ED Course:  Patient presents the emergency department complaints of a migraine.  Prior history of migraine headaches.  States that he typically takes Topamax, rizatriptan, Emgality but her symptoms of not been improving.  Symptoms initially began about a week ago on Tuesday and continues to endorse photosensitivity and nausea.  Denies any abdominal pain, vomiting, diarrhea.  Does report that she recently had MRI about 2 or 3 months ago which did show a small cyst on her pineal gland.  She is planning on following up with [facility] neurology for second opinion on this.  Will initiate evaluation with basic labs including CBC, CMP, COVID-19.  Given prior history of recently abnormal imaging of the head, order CT head.  Patient denies any chance of pregnancy so bypassed urine pregnancy testing.  Assuming CT head is negative for any signs of any bleed, will initiate treatment for migraine with migraine cocktail after CT head results. Patient's labs and imaging are unremarkable at this time.  Migraine cocktail initiated. Reassessed patient approximately after administration of fluids, Compazine, Benadryl and she reports improvement in her migraine headache but no resolution at this point.  Will add on Toradol 15 mg. Patient reports that migraine headache is now resolved with migraine cocktail here in the emergency department.  Will plan on discharging home with outpatient follow-up with neurology.  Patient in agreement with treatment plan and verbalized understanding strict return precautions.  All questions answered prior to patient  discharge.  Final Clinical Impression(s) / ED Diagnoses Final diagnoses:  Other migraine without status migrainosus, not intractable    Rx / DC Orders ED Discharge Orders     None         Smitty Knudsen, PA-C 08/23/22 1419    Cathren Laine, MD 08/23/22 1438

## 2022-08-23 NOTE — Discharge Instructions (Signed)
You are seen in the emergency department for a migraine headache.  A migraine cocktail of medications including fluids, Compazine, Benadryl, Toradol were administered which did appear to resolve her migraine headache.  Please continue to take your migraine medications at home.  If you have any recurrence of your symptoms, please return the emergency department.  Otherwise follow-up with your neurologist for further evaluation.

## 2022-08-25 ENCOUNTER — Ambulatory Visit: Payer: No Typology Code available for payment source | Admitting: Neurology

## 2022-09-02 IMAGING — MG MM DIGITAL DIAGNOSTIC UNILAT*L* W/ TOMO W/ CAD
8 series · 8 of 24 positions shown · non-contrast
Comparison: Previous exam(s).

CLINICAL DATA: 45-year-old female presenting as a recall from
screening for possible left breast asymmetry.

EXAM:
DIGITAL DIAGNOSTIC UNILATERAL LEFT MAMMOGRAM WITH TOMOSYNTHESIS AND
CAD; ULTRASOUND LEFT BREAST LIMITED
TECHNIQUE: Left digital diagnostic mammography and breast tomosynthesis was
performed. The images were evaluated with computer-aided detection.;
Targeted ultrasound examination of the left breast was performed.

[L CC synth-2D (1 of 2)]
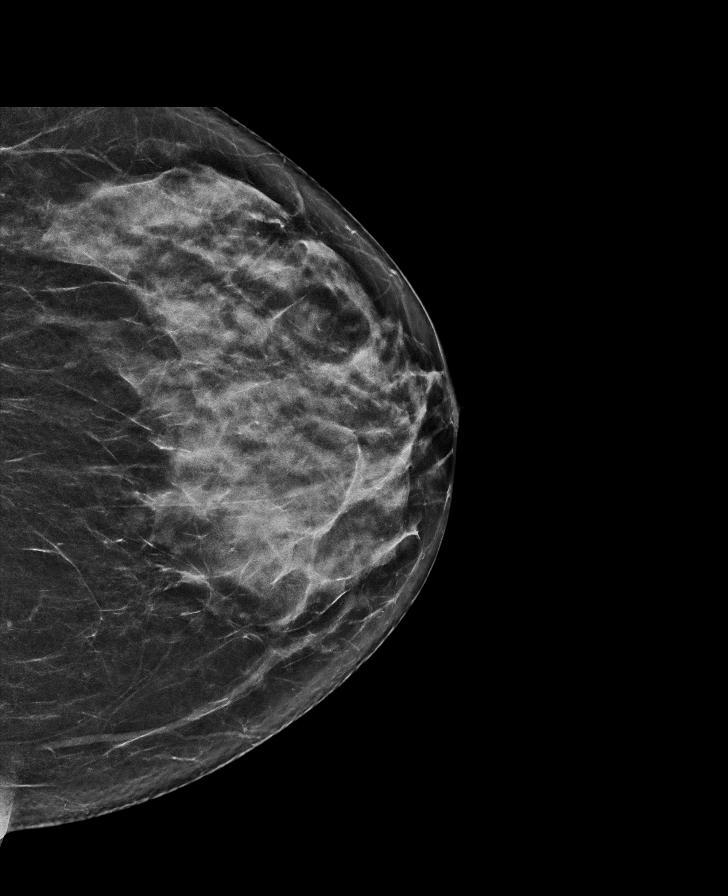

[L CC synth-2D (2 of 2)]
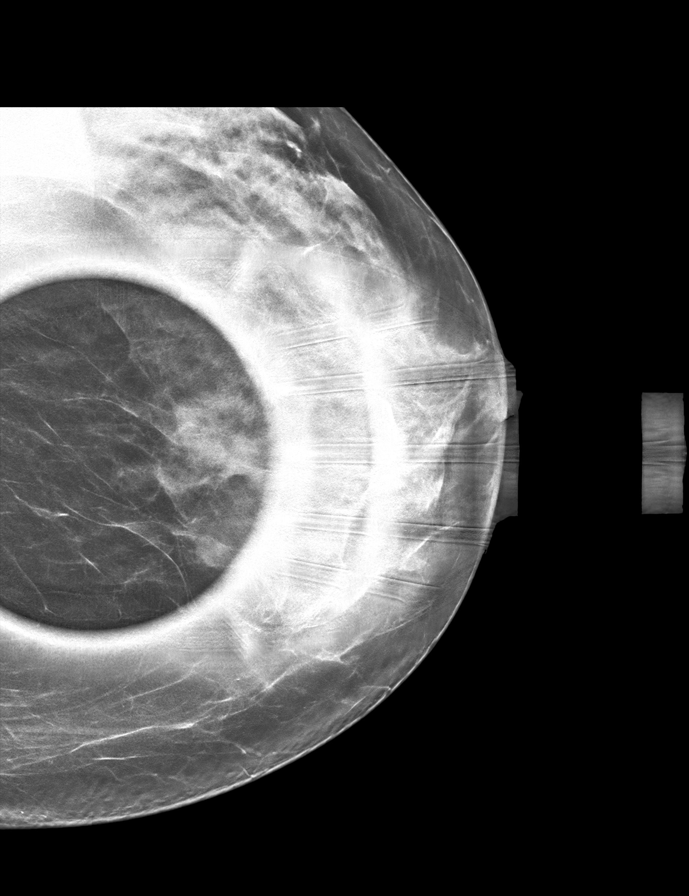

[L MLO synth-2D]
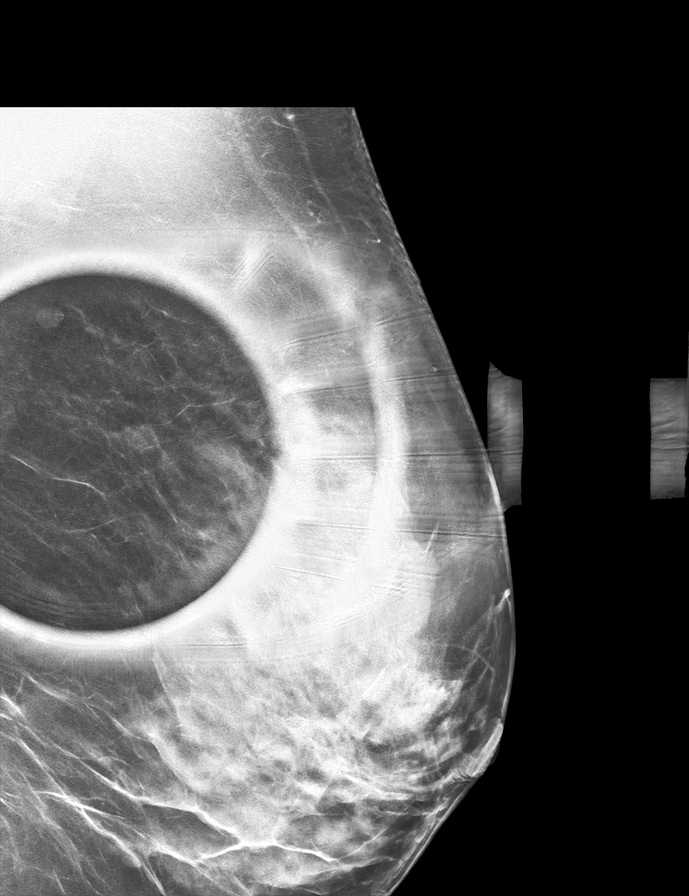

[L ML synth-2D]
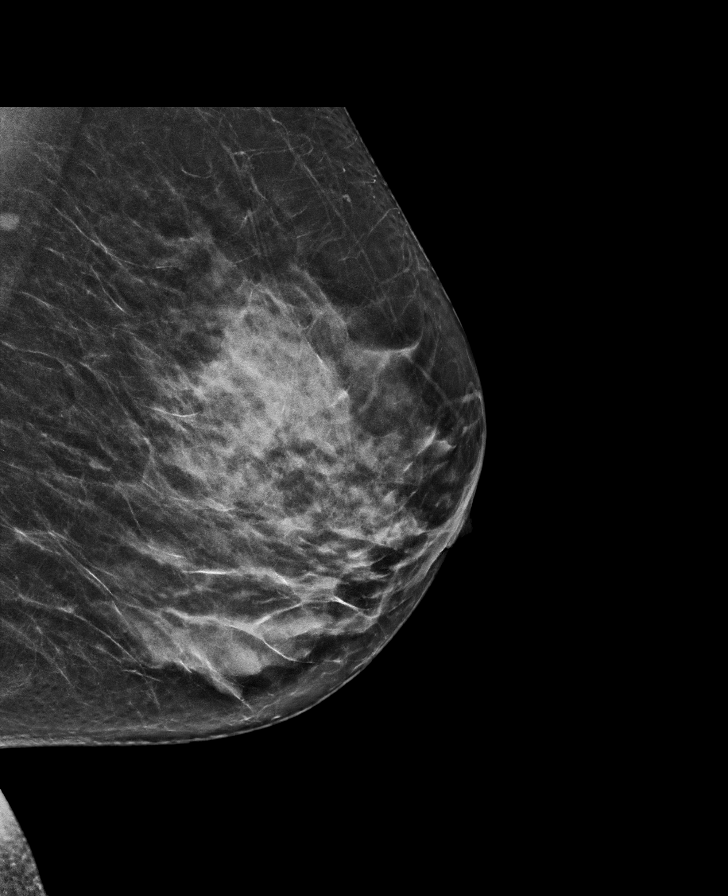

[L ML tomo · tomo slice 33/66.0]
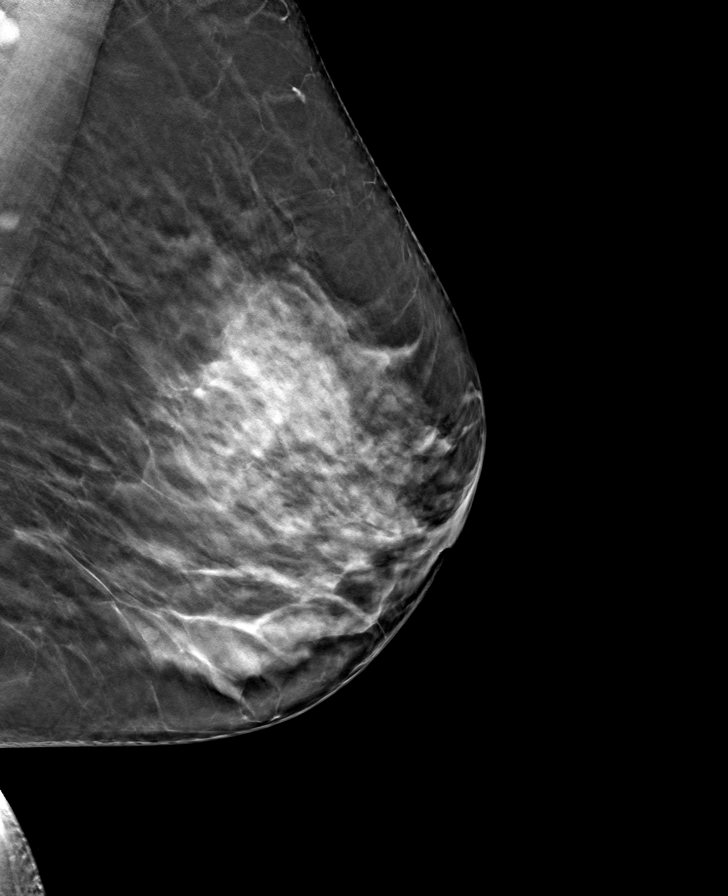

[L CC tomo (1 of 2) · tomo slice 34/67.0]
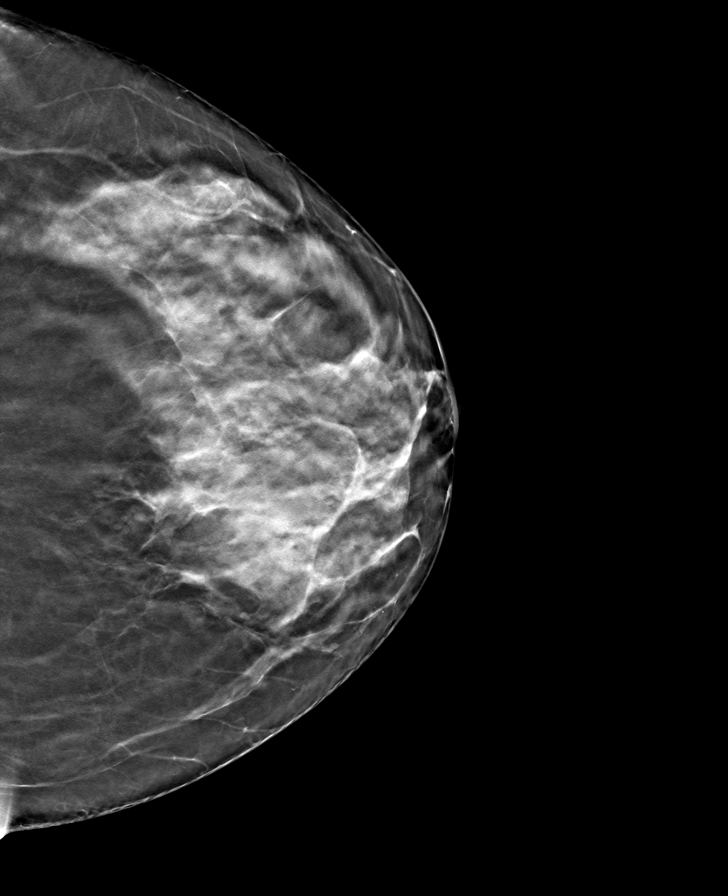

[L CC tomo (2 of 2) · tomo slice 31/61.0]
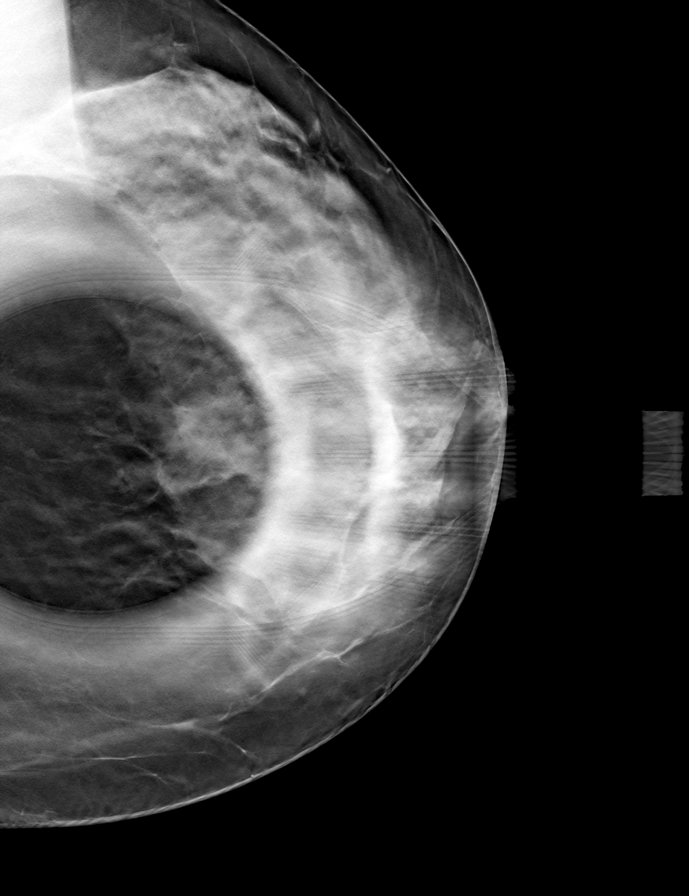

[L MLO tomo · tomo slice 33/66.0]
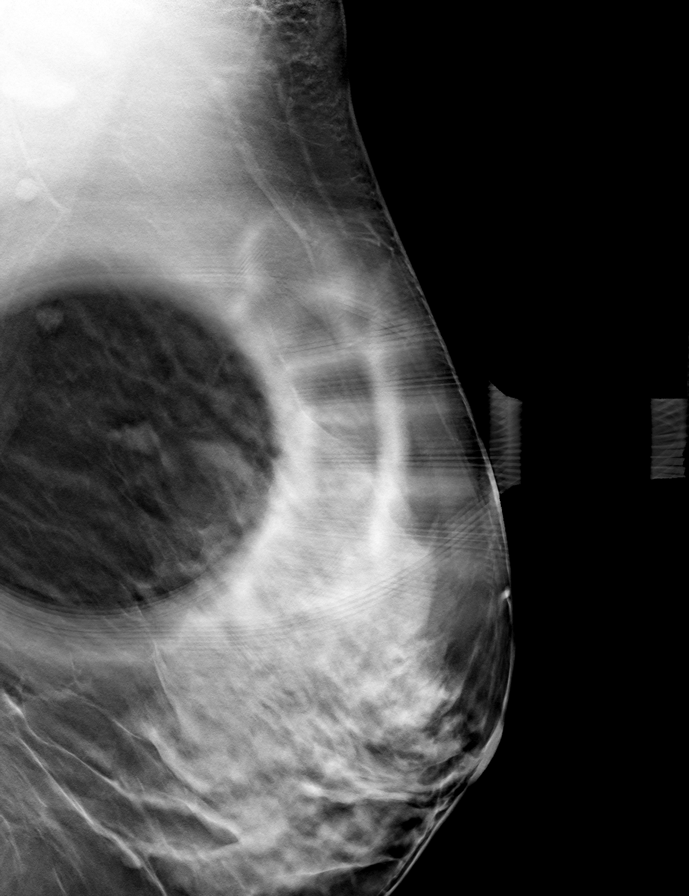

[8 of 24 positions shown; findings below may reference images not displayed]

ACR Breast Density Category c: The breast tissue is heterogeneously
dense, which may obscure small masses.
FINDINGS: Mammogram:

Full field cc and true lateral as well as spot compression
tomosynthesis views of the left breast were performed for a
questioned asymmetry in the upper slightly inner left breast. The
asymmetry resolves on the full cc and spot compression views and is
less conspicuous on the true lateral view, likely representing
normal fibroglandular tissue. There is no definite mass or
distortion.

Ultrasound:

Targeted ultrasound is performed in the left breast from [DATE] to
[DATE] demonstrating normal fibroglandular tissue. There is no cystic
or solid mass.
IMPRESSION: No mammographic or sonographic evidence of malignancy in the upper
inner left breast.

RECOMMENDATION:
Screening mammogram in one year.(Code:[id number]-K2O)

I have discussed the findings and recommendations with the patient.
If applicable, a reminder letter will be sent to the patient
regarding the next appointment.

BI-RADS CATEGORY  1: Negative.

## 2022-09-06 DIAGNOSIS — G4489 Other headache syndrome: Secondary | ICD-10-CM | POA: Diagnosis not present

## 2022-09-06 DIAGNOSIS — G4485 Primary stabbing headache: Secondary | ICD-10-CM | POA: Diagnosis not present

## 2022-09-06 DIAGNOSIS — G43019 Migraine without aura, intractable, without status migrainosus: Secondary | ICD-10-CM | POA: Diagnosis not present

## 2022-09-27 DIAGNOSIS — R5383 Other fatigue: Secondary | ICD-10-CM | POA: Diagnosis not present

## 2022-09-27 DIAGNOSIS — D649 Anemia, unspecified: Secondary | ICD-10-CM | POA: Diagnosis not present

## 2022-09-27 DIAGNOSIS — E559 Vitamin D deficiency, unspecified: Secondary | ICD-10-CM | POA: Diagnosis not present

## 2022-09-27 DIAGNOSIS — R790 Abnormal level of blood mineral: Secondary | ICD-10-CM | POA: Diagnosis not present

## 2022-09-27 DIAGNOSIS — E611 Iron deficiency: Secondary | ICD-10-CM | POA: Diagnosis not present

## 2022-09-27 DIAGNOSIS — E282 Polycystic ovarian syndrome: Secondary | ICD-10-CM | POA: Diagnosis not present

## 2022-10-05 DIAGNOSIS — G43909 Migraine, unspecified, not intractable, without status migrainosus: Secondary | ICD-10-CM | POA: Diagnosis not present

## 2022-10-05 DIAGNOSIS — E559 Vitamin D deficiency, unspecified: Secondary | ICD-10-CM | POA: Diagnosis not present

## 2022-10-05 DIAGNOSIS — E611 Iron deficiency: Secondary | ICD-10-CM | POA: Diagnosis not present

## 2022-10-10 DIAGNOSIS — G43019 Migraine without aura, intractable, without status migrainosus: Secondary | ICD-10-CM | POA: Diagnosis not present

## 2022-10-10 DIAGNOSIS — G4485 Primary stabbing headache: Secondary | ICD-10-CM | POA: Diagnosis not present

## 2022-10-20 ENCOUNTER — Other Ambulatory Visit (HOSPITAL_BASED_OUTPATIENT_CLINIC_OR_DEPARTMENT_OTHER): Payer: Self-pay

## 2022-10-20 ENCOUNTER — Emergency Department (HOSPITAL_BASED_OUTPATIENT_CLINIC_OR_DEPARTMENT_OTHER)
Admission: EM | Admit: 2022-10-20 | Discharge: 2022-10-20 | Disposition: A | Discharge status: Home / Self Care | Payer: No Typology Code available for payment source | Attending: Emergency Medicine | Admitting: Emergency Medicine

## 2022-10-20 ENCOUNTER — Encounter (HOSPITAL_BASED_OUTPATIENT_CLINIC_OR_DEPARTMENT_OTHER): Payer: Self-pay | Admitting: Urology

## 2022-10-20 ENCOUNTER — Emergency Department (HOSPITAL_BASED_OUTPATIENT_CLINIC_OR_DEPARTMENT_OTHER): Payer: No Typology Code available for payment source | Admitting: Radiology

## 2022-10-20 ENCOUNTER — Other Ambulatory Visit: Payer: Self-pay

## 2022-10-20 DIAGNOSIS — M25512 Pain in left shoulder: Secondary | ICD-10-CM | POA: Insufficient documentation

## 2022-10-20 DIAGNOSIS — Z8541 Personal history of malignant neoplasm of cervix uteri: Secondary | ICD-10-CM | POA: Insufficient documentation

## 2022-10-20 DIAGNOSIS — G8929 Other chronic pain: Secondary | ICD-10-CM | POA: Diagnosis not present

## 2022-10-20 DIAGNOSIS — J45909 Unspecified asthma, uncomplicated: Secondary | ICD-10-CM | POA: Insufficient documentation

## 2022-10-20 MED ORDER — CYCLOBENZAPRINE HCL 5 MG PO TABS
5.0000 mg | ORAL_TABLET | Freq: Once | ORAL | Status: AC
  Administered 2022-10-20: 5 mg via ORAL
  Filled 2022-10-20: qty 1

## 2022-10-20 MED ORDER — KETOROLAC TROMETHAMINE 60 MG/2ML IM SOLN
30.0000 mg | Freq: Once | INTRAMUSCULAR | Status: AC
  Administered 2022-10-20: 30 mg via INTRAMUSCULAR
  Filled 2022-10-20: qty 2

## 2022-10-20 MED ORDER — DEXAMETHASONE SODIUM PHOSPHATE 10 MG/ML IJ SOLN
10.0000 mg | Freq: Once | INTRAMUSCULAR | Status: AC
  Administered 2022-10-20: 10 mg via INTRAMUSCULAR
  Filled 2022-10-20: qty 1

## 2022-10-20 MED ORDER — PREDNISONE 10 MG PO TABS
ORAL_TABLET | ORAL | 0 refills | Status: AC
  Filled 2022-10-20: qty 42, 12d supply, fill #0

## 2022-10-20 MED ORDER — LIDOCAINE 5 % EX PTCH
1.0000 | MEDICATED_PATCH | CUTANEOUS | Status: DC
  Administered 2022-10-20: 1 via TRANSDERMAL
  Filled 2022-10-20: qty 1

## 2022-10-20 MED ORDER — LIDOCAINE 4 % EX PTCH
1.0000 | MEDICATED_PATCH | CUTANEOUS | 0 refills | Status: AC
  Filled 2022-10-20: qty 7, 7d supply, fill #0
  Filled 2022-10-26: qty 5, 5d supply, fill #0

## 2022-10-20 MED ORDER — CYCLOBENZAPRINE HCL 10 MG PO TABS
10.0000 mg | ORAL_TABLET | Freq: Two times a day (BID) | ORAL | 0 refills | Status: AC | PRN
  Filled 2022-10-20: qty 20, 10d supply, fill #0

## 2022-10-20 NOTE — ED Triage Notes (Signed)
Left chronic shoulder pain x 2 years  States worsening over past week  Very limited ROM, pain with lifting arm  Denies any injury

## 2022-10-20 NOTE — Discharge Instructions (Addendum)
Please take tylenol/ibuprofen, muscle relaxant, steroid for pain. I recommend close follow-up with orthopedics or PCP for reevaluation.  Please do not hesitate to return to emergency department if worrisome signs symptoms we discussed become apparent.

## 2022-10-20 NOTE — ED Provider Notes (Signed)
[place] EMERGENCY DEPARTMENT AT [facility] Provider Note   CSN: [id number] Arrival date & time: 10/20/22  1058     History  Chief Complaint  Patient presents with   Shoulder Pain    Robin Weaver is a 47 y.o. female with a past medical history of asthma, GERD, migraine, essential tremor presents today for evaluation of shoulder pain.  Patient reported she has had chronic left shoulder pain in last 2 years.  States that it got worse over the past week.  Patient stated she can barely move her arm past 90 degrees.  Yesterday she tried to pick up her dog and it was painful.  She denies any recent fall or direct trauma to her shoulder.  She has not seen any orthopedics for this.  Denies any fever, joint swelling, skin redness on her shoulder.   Shoulder Pain   Past Medical History:  Diagnosis Date   Anemia    Anxiety    Arthritis    upper spine   Asthma    Breast pain, left    left diffuse breast pain x 10/2015   Cervical cancer (HCC)    Treated with TLH/BS, pathology from specimen only with CIN III   Chronic back pain    COVID    January 2023   Depression    Family history of adverse reaction to anesthesia    mother has trouble waking up and nausea   GERD (gastroesophageal reflux disease)    Migraine without aura    PCOS (polycystic ovarian syndrome)    PONV (postoperative nausea and vomiting)    TMJ disease    Tremor    left hand   Past Surgical History:  Procedure Laterality Date   ARTERY BIOPSY Right 01/05/2022   Procedure: RIGHT BIOPSY TEMPORAL ARTERY;  Surgeon: Serena Colonel, MD;  Location: [facility] OR;  Service: ENT;  Laterality: Right;   GIVENS CAPSULE STUDY N/A 08/08/2022   Procedure: GIVENS CAPSULE STUDY;  Surgeon: Charna Elizabeth, MD;  Location: [facility] ENDOSCOPY;  Service: Gastroenterology;  Laterality: N/A;   ROBOTIC ASSISTED TOTAL HYSTERECTOMY     With BS, had a ureteral injury, treated with a stent.    WRIST SURGERY       Home Medications Prior to  Admission medications   Medication Sig Start Date End Date Taking? Authorizing Provider  indomethacin (INDOCIN) 25 MG capsule Take 25 mg by mouth 2 (two) times daily. 10/08/22  Yes [provider]  albuterol (PROVENTIL HFA;VENTOLIN HFA) 108 (90 BASE) MCG/ACT inhaler Inhale 2 puffs into the lungs every 6 (six) hours as needed for wheezing or shortness of breath. For asthma    [provider]  ALPRAZolam Prudy Feeler) 0.5 MG tablet Take 0.5 mg by mouth 2 (two) times daily as needed for anxiety. 05/11/09   [provider]  budesonide-formoterol (SYMBICORT) 160-4.5 MCG/ACT inhaler Inhale 2 puffs into the lungs 2 (two) times daily.    [provider]  Cholecalciferol (VITAMIN D) 125 MCG (5000 UT) CAPS Take 5,000 Units by mouth daily.    [provider]  EMGALITY 120 MG/ML SOAJ Inject 120 mg into the skin every 30 (thirty) days. 07/22/22   [provider]  EPINEPHrine 0.3 mg/0.3 mL IJ SOAJ injection Inject 0.3 mg into the muscle as needed for anaphylaxis. 05/08/21   Tilden Fossa, MD  escitalopram (LEXAPRO) 20 MG tablet Take 20 mg by mouth at bedtime. 10/27/20   [provider]  ferrous sulfate 324 MG TBEC Take 324 mg  by mouth every Monday, Wednesday, and Friday.    [provider]  LORYNA 3-0.02 MG tablet Take 1 tablet by mouth daily. 07/28/22   [provider]  meloxicam (MOBIC) 7.5 MG tablet Take 7.5 mg by mouth daily as needed for pain.    [provider]  montelukast (SINGULAIR) 10 MG tablet Take 10 mg by mouth at bedtime.    [provider]  omeprazole (PRILOSEC) 20 MG capsule Take 20 mg by mouth daily as needed. 07/28/22   [provider]  oxybutynin (DITROPAN-XL) 5 MG 24 hr tablet Take 5 mg by mouth at bedtime. 01/19/21   [provider]  Prenatal Vit-Fe Fumarate-FA (PRENATAL VITAMINS) 28-0.8 MG TABS Take 1 tablet by mouth daily. 07/29/21   [provider]  rizatriptan (MAXALT-MLT) 10 MG  disintegrating tablet Take 1 tab at onset of migraine.  May repeat in 2 hrs, if needed.  Max dose: 2 tabs/day. This is a 30 day prescription. 05/09/22   Levert Feinstein, MD  topiramate (TOPAMAX) 100 MG tablet Take 100 mg by mouth at bedtime.    [provider]  WEGOVY 1 MG/0.5ML SOAJ Inject 1 mg into the skin once a week. 07/28/22   [provider]  Monte Fantasia INHUB 250-50 MCG/ACT AEPB Inhale 1 puff into the lungs 2 (two) times daily. 08/09/22   [provider]      Allergies    Hydrocodone-acetaminophen and Vicodin [hydrocodone-acetaminophen]    Review of Systems   Review of Systems Negative except as per HPI.  Physical Exam Updated Vital Signs BP 131/72 (BP Location: Right Arm)   Pulse 71   Temp 97.9 F (36.6 C) (Oral)   Resp 18   Ht 5\' 9"  (1.753 m)   Wt 100.2 kg   SpO2 100%   BMI 32.62 kg/m  Physical Exam Vitals and nursing note reviewed.  Constitutional:      Appearance: Normal appearance.  HENT:     Head: Normocephalic and atraumatic.     Mouth/Throat:     Mouth: Mucous membranes are moist.  Eyes:     General: No scleral icterus. Cardiovascular:     Rate and Rhythm: Normal rate and regular rhythm.     Pulses: Normal pulses.     Heart sounds: Normal heart sounds.  Pulmonary:     Effort: Pulmonary effort is normal.     Breath sounds: Normal breath sounds.  Abdominal:     General: Abdomen is flat.     Palpations: Abdomen is soft.     Tenderness: There is no abdominal tenderness.  Musculoskeletal:        General: No deformity.     Comments: Tenderness to palpation to left shoulder.  No overlying skin erythema.  No swelling.  Skin is cool to touch.  Limited active and passive range of motion due to pain.  Skin:    General: Skin is warm.     Findings: No rash.  Neurological:     General: No focal deficit present.     Mental Status: She is alert.  Psychiatric:        Mood and Affect: Mood normal.     ED Results / Procedures / Treatments    Labs (all labs ordered are listed, but only abnormal results are displayed) Labs Reviewed - No data to display  EKG None  Radiology No results found.  Procedures Procedures    Medications Ordered in ED Medications  lidocaine (LIDODERM) 5 % 1 patch (has no administration  in time range)  ketorolac (TORADOL) injection 30 mg (has no administration in time range)  cyclobenzaprine (FLEXERIL) tablet 5 mg (has no administration in time range)  dexamethasone (DECADRON) injection 10 mg (has no administration in time range)    ED Course/ Medical Decision Making/ A&P                                 Medical Decision Making Amount and/or Complexity of Data Reviewed Radiology: ordered.  Risk OTC drugs. Prescription drug management.   This patient presents to the ED for shoulder pain, this involves an extensive number of treatment options, and is a complaint that carries with a high risk of complications and morbidity.  The differential diagnosis includes fracture, dislocation, ligamentous injury, frozen shoulder syndrome.  This is not an exhaustive list.  Imaging studies: I ordered imaging studies, personally reviewed, interpreted imaging and agree with the radiologist's interpretations. The results include: Negative shoulder x-ray  Problem list/ ED course/ Critical interventions/ Medical management: HPI: See above Vital signs within normal range and stable throughout visit. Laboratory/imaging studies significant for: See above. On physical examination, patient is afebrile and appears in no acute distress. Patient presents with shoulder joint pain. Given history, exam and workup patient likely has arthritis versus frozen shoulder syndrome. I have low suspicion for fracture, dislocation, significant ligamentous injury, septic arthritis, gout flare, new autoimmune arthropathy, or gonococcal arthropathy.  Given Flexeril, Decadron and Toradol.  Reevaluation of patient after these  medications showed that patient improved. Advised patient to take Tylenol/ibuprofen/naproxen for pain, follow-up with orthopedics or primary care physician for further evaluation and management, return to the ER if new or worsening symptoms. I have reviewed the patient home medicines and have made adjustments as needed.  Cardiac monitoring/EKG: The patient was maintained on a cardiac monitor.  I personally reviewed and interpreted the cardiac monitor which showed an underlying rhythm of: sinus rhythm.  Additional history obtained: External records from outside source obtained and reviewed including: Chart review including previous notes, labs, imaging.  Consultations obtained:  Disposition Continued outpatient therapy. Follow-up with orthopedics or PCP recommended for reevaluation of symptoms. Treatment plan discussed with patient.  Pt acknowledged understanding was agreeable to the plan. Worrisome signs and symptoms were discussed with patient, and patient acknowledged understanding to return to the ED if they noticed these signs and symptoms. Patient was stable upon discharge.   This chart was dictated using voice recognition software.  Despite best efforts to proofread,  errors can occur which can change the documentation meaning.          Final Clinical Impression(s) / ED Diagnoses Final diagnoses:  Acute pain of left shoulder    Rx / DC Orders ED Discharge Orders          Ordered    lidocaine (HM LIDOCAINE PATCH) 4 %  Every 24 hours        10/20/22 1451    cyclobenzaprine (FLEXERIL) 10 MG tablet  2 times daily PRN        10/20/22 1451    predniSONE (DELTASONE) 10 MG tablet        10/20/22 1451              Jeanelle Malling, [place] 10/21/22 1101    Linwood Dibbles, MD 10/24/22 1723

## 2022-10-20 NOTE — ED Notes (Signed)
PA at bedside.

## 2022-10-20 NOTE — ED Notes (Signed)
Patient transported to X-ray 

## 2022-10-27 ENCOUNTER — Other Ambulatory Visit (HOSPITAL_BASED_OUTPATIENT_CLINIC_OR_DEPARTMENT_OTHER): Payer: Self-pay

## 2022-11-01 DIAGNOSIS — M7500 Adhesive capsulitis of unspecified shoulder: Secondary | ICD-10-CM | POA: Diagnosis not present

## 2022-11-01 DIAGNOSIS — B370 Candidal stomatitis: Secondary | ICD-10-CM | POA: Diagnosis not present

## 2022-11-08 ENCOUNTER — Other Ambulatory Visit (HOSPITAL_BASED_OUTPATIENT_CLINIC_OR_DEPARTMENT_OTHER): Payer: Self-pay

## 2022-11-16 DIAGNOSIS — M7542 Impingement syndrome of left shoulder: Secondary | ICD-10-CM | POA: Diagnosis not present

## 2022-12-01 DIAGNOSIS — J4540 Moderate persistent asthma, uncomplicated: Secondary | ICD-10-CM | POA: Diagnosis not present

## 2022-12-07 DIAGNOSIS — M25512 Pain in left shoulder: Secondary | ICD-10-CM | POA: Diagnosis not present

## 2022-12-19 DIAGNOSIS — G43709 Chronic migraine without aura, not intractable, without status migrainosus: Secondary | ICD-10-CM | POA: Diagnosis not present

## 2022-12-19 DIAGNOSIS — R519 Headache, unspecified: Secondary | ICD-10-CM | POA: Diagnosis not present

## 2023-01-16 DIAGNOSIS — E669 Obesity, unspecified: Secondary | ICD-10-CM | POA: Diagnosis not present

## 2023-02-13 DIAGNOSIS — Z1159 Encounter for screening for other viral diseases: Secondary | ICD-10-CM | POA: Diagnosis not present

## 2023-02-13 DIAGNOSIS — J4540 Moderate persistent asthma, uncomplicated: Secondary | ICD-10-CM | POA: Diagnosis not present

## 2023-02-13 DIAGNOSIS — J069 Acute upper respiratory infection, unspecified: Secondary | ICD-10-CM | POA: Diagnosis not present

## 2023-02-16 DIAGNOSIS — E669 Obesity, unspecified: Secondary | ICD-10-CM | POA: Diagnosis not present

## 2023-02-28 DIAGNOSIS — B3731 Acute candidiasis of vulva and vagina: Secondary | ICD-10-CM | POA: Diagnosis not present

## 2023-02-28 DIAGNOSIS — N760 Acute vaginitis: Secondary | ICD-10-CM | POA: Diagnosis not present

## 2023-03-08 DIAGNOSIS — E66811 Obesity, class 1: Secondary | ICD-10-CM | POA: Diagnosis not present

## 2023-03-08 DIAGNOSIS — M7500 Adhesive capsulitis of unspecified shoulder: Secondary | ICD-10-CM | POA: Diagnosis not present

## 2023-03-17 DIAGNOSIS — E669 Obesity, unspecified: Secondary | ICD-10-CM | POA: Diagnosis not present

## 2023-04-11 DIAGNOSIS — G4485 Primary stabbing headache: Secondary | ICD-10-CM | POA: Diagnosis not present

## 2023-04-11 DIAGNOSIS — G43909 Migraine, unspecified, not intractable, without status migrainosus: Secondary | ICD-10-CM | POA: Diagnosis not present

## 2023-05-11 ENCOUNTER — Other Ambulatory Visit: Payer: Self-pay | Admitting: Nurse Practitioner

## 2023-05-11 DIAGNOSIS — Z1231 Encounter for screening mammogram for malignant neoplasm of breast: Secondary | ICD-10-CM

## 2023-05-26 ENCOUNTER — Ambulatory Visit
Admission: RE | Admit: 2023-05-26 | Discharge: 2023-05-26 | Disposition: A | Discharge status: Home / Self Care | Source: Ambulatory Visit | Attending: Nurse Practitioner | Admitting: Nurse Practitioner

## 2023-05-26 ENCOUNTER — Other Ambulatory Visit: Payer: Self-pay | Admitting: Nurse Practitioner

## 2023-05-26 DIAGNOSIS — N644 Mastodynia: Secondary | ICD-10-CM

## 2023-05-26 DIAGNOSIS — Z1231 Encounter for screening mammogram for malignant neoplasm of breast: Secondary | ICD-10-CM

## 2023-05-31 ENCOUNTER — Other Ambulatory Visit: Payer: Self-pay | Admitting: Neurology

## 2023-06-23 ENCOUNTER — Ambulatory Visit
Admission: RE | Admit: 2023-06-23 | Discharge: 2023-06-23 | Disposition: A | Discharge status: Home / Self Care | Source: Ambulatory Visit | Attending: Nurse Practitioner | Admitting: Nurse Practitioner

## 2023-06-23 DIAGNOSIS — N644 Mastodynia: Secondary | ICD-10-CM

## 2023-12-19 ENCOUNTER — Other Ambulatory Visit (HOSPITAL_BASED_OUTPATIENT_CLINIC_OR_DEPARTMENT_OTHER): Payer: Self-pay | Admitting: Family Medicine

## 2023-12-19 DIAGNOSIS — M25812 Other specified joint disorders, left shoulder: Secondary | ICD-10-CM

## 2023-12-22 ENCOUNTER — Ambulatory Visit (HOSPITAL_BASED_OUTPATIENT_CLINIC_OR_DEPARTMENT_OTHER): Admission: RE | Admit: 2023-12-22 | Discharge: 2023-12-22 | Attending: Family Medicine | Admitting: Family Medicine

## 2023-12-22 DIAGNOSIS — M25812 Other specified joint disorders, left shoulder: Secondary | ICD-10-CM

## 2024-02-13 ENCOUNTER — Telehealth: Payer: Self-pay

## 2024-02-13 NOTE — Telephone Encounter (Signed)
 Called and left a message asking her to call the office back to schedule appt with Dr. Lonn next month. MD appt only, labs not needed.

## 2024-02-20 ENCOUNTER — Telehealth: Payer: Self-pay

## 2024-02-29 ENCOUNTER — Inpatient Hospital Stay: Admitting: Hematology and Oncology

## 2024-03-08 ENCOUNTER — Encounter (HOSPITAL_BASED_OUTPATIENT_CLINIC_OR_DEPARTMENT_OTHER): Payer: Self-pay

## 2024-03-08 ENCOUNTER — Ambulatory Visit (HOSPITAL_BASED_OUTPATIENT_CLINIC_OR_DEPARTMENT_OTHER): Admit: 2024-03-08 | Admitting: Orthopedic Surgery

## 2024-03-08 SURGERY — MANIPULATION, JOINT, SHOULDER, WITH ANESTHESIA
Anesthesia: Choice | Site: Shoulder | Laterality: Left
